# Patient Record
Sex: Male | Born: 1940 | Race: White | Hispanic: No | Marital: Married | Smoking: Former smoker
Health system: Southern US, Community
[De-identification: ages and names within clinical notes are randomized; demographics above are authoritative.]

## PROBLEM LIST (undated history)

## (undated) DIAGNOSIS — I10 Essential (primary) hypertension: Secondary | ICD-10-CM

## (undated) DIAGNOSIS — E785 Hyperlipidemia, unspecified: Secondary | ICD-10-CM

## (undated) DIAGNOSIS — K219 Gastro-esophageal reflux disease without esophagitis: Secondary | ICD-10-CM

## (undated) DIAGNOSIS — J9801 Acute bronchospasm: Secondary | ICD-10-CM

## (undated) DIAGNOSIS — I519 Heart disease, unspecified: Secondary | ICD-10-CM

## (undated) DIAGNOSIS — M5136 Other intervertebral disc degeneration, lumbar region: Secondary | ICD-10-CM

## (undated) DIAGNOSIS — M51369 Other intervertebral disc degeneration, lumbar region without mention of lumbar back pain or lower extremity pain: Secondary | ICD-10-CM

## (undated) DIAGNOSIS — N200 Calculus of kidney: Secondary | ICD-10-CM

## (undated) DIAGNOSIS — J329 Chronic sinusitis, unspecified: Secondary | ICD-10-CM

## (undated) DIAGNOSIS — N400 Enlarged prostate without lower urinary tract symptoms: Secondary | ICD-10-CM

## (undated) DIAGNOSIS — M1990 Unspecified osteoarthritis, unspecified site: Secondary | ICD-10-CM

## (undated) DIAGNOSIS — K222 Esophageal obstruction: Secondary | ICD-10-CM

## (undated) DIAGNOSIS — R0683 Snoring: Secondary | ICD-10-CM

## (undated) HISTORY — DX: Other intervertebral disc degeneration, lumbar region: M51.36

## (undated) HISTORY — DX: Acute bronchospasm: J98.01

## (undated) HISTORY — DX: Other intervertebral disc degeneration, lumbar region without mention of lumbar back pain or lower extremity pain: M51.369

## (undated) HISTORY — DX: Snoring: R06.83

## (undated) HISTORY — DX: Heart disease, unspecified: I51.9

## (undated) HISTORY — PX: CATARACT EXTRACTION: SUR2

## (undated) HISTORY — DX: Benign prostatic hyperplasia without lower urinary tract symptoms: N40.0

## (undated) HISTORY — PX: COLONOSCOPY: SHX174

## (undated) HISTORY — PX: NASAL SINUS SURGERY: SHX719

## (undated) HISTORY — DX: Chronic sinusitis, unspecified: J32.9

## (undated) HISTORY — DX: Hyperlipidemia, unspecified: E78.5

## (undated) HISTORY — DX: Essential (primary) hypertension: I10

## (undated) HISTORY — DX: Esophageal obstruction: K22.2

---

## 1999-07-21 ENCOUNTER — Ambulatory Visit (HOSPITAL_COMMUNITY): Admission: RE | Admit: 1999-07-21 | Discharge: 1999-07-21 | Payer: Self-pay | Admitting: Cardiology

## 1999-07-21 ENCOUNTER — Emergency Department (HOSPITAL_COMMUNITY): Admission: EM | Admit: 1999-07-21 | Discharge: 1999-07-21 | Payer: Self-pay | Admitting: Emergency Medicine

## 1999-07-23 ENCOUNTER — Encounter: Payer: Self-pay | Admitting: *Deleted

## 1999-07-26 ENCOUNTER — Inpatient Hospital Stay (HOSPITAL_COMMUNITY): Admission: RE | Admit: 1999-07-26 | Discharge: 1999-07-28 | Payer: Self-pay | Admitting: *Deleted

## 1999-07-26 ENCOUNTER — Encounter (INDEPENDENT_AMBULATORY_CARE_PROVIDER_SITE_OTHER): Payer: Self-pay

## 1999-10-01 ENCOUNTER — Encounter: Payer: Self-pay | Admitting: Gastroenterology

## 1999-10-01 ENCOUNTER — Encounter: Admission: RE | Admit: 1999-10-01 | Discharge: 1999-10-01 | Payer: Self-pay | Admitting: Gastroenterology

## 1999-10-14 ENCOUNTER — Encounter: Payer: Self-pay | Admitting: Gastroenterology

## 1999-10-14 ENCOUNTER — Ambulatory Visit (HOSPITAL_COMMUNITY): Admission: RE | Admit: 1999-10-14 | Discharge: 1999-10-14 | Payer: Self-pay | Admitting: Gastroenterology

## 2000-05-03 ENCOUNTER — Ambulatory Visit (HOSPITAL_COMMUNITY): Admission: RE | Admit: 2000-05-03 | Discharge: 2000-05-03 | Payer: Self-pay | Admitting: Orthopedic Surgery

## 2000-05-08 HISTORY — PX: HEEL SPUR SURGERY: SHX665

## 2000-07-08 HISTORY — PX: TRANSURETHRAL RESECTION OF PROSTATE: SHX73

## 2000-07-25 ENCOUNTER — Ambulatory Visit (HOSPITAL_BASED_OUTPATIENT_CLINIC_OR_DEPARTMENT_OTHER): Admission: RE | Admit: 2000-07-25 | Discharge: 2000-07-25 | Payer: Self-pay | Admitting: Plastic Surgery

## 2000-07-25 ENCOUNTER — Encounter (INDEPENDENT_AMBULATORY_CARE_PROVIDER_SITE_OTHER): Payer: Self-pay | Admitting: Specialist

## 2000-11-07 HISTORY — PX: ESOPHAGEAL DILATION: SHX303

## 2002-05-25 ENCOUNTER — Observation Stay (HOSPITAL_COMMUNITY): Admission: EM | Admit: 2002-05-25 | Discharge: 2002-05-26 | Payer: Self-pay | Admitting: Emergency Medicine

## 2002-05-25 ENCOUNTER — Encounter: Payer: Self-pay | Admitting: Emergency Medicine

## 2003-12-12 ENCOUNTER — Encounter: Admission: RE | Admit: 2003-12-12 | Discharge: 2003-12-12 | Payer: Self-pay | Admitting: Otolaryngology

## 2008-06-06 ENCOUNTER — Emergency Department (HOSPITAL_COMMUNITY): Admission: EM | Admit: 2008-06-06 | Discharge: 2008-06-06 | Payer: Self-pay | Admitting: Emergency Medicine

## 2008-06-13 ENCOUNTER — Ambulatory Visit (HOSPITAL_COMMUNITY): Admission: RE | Admit: 2008-06-13 | Discharge: 2008-06-13 | Payer: Self-pay | Admitting: Cardiovascular Disease

## 2008-06-23 ENCOUNTER — Inpatient Hospital Stay (HOSPITAL_BASED_OUTPATIENT_CLINIC_OR_DEPARTMENT_OTHER): Admission: RE | Admit: 2008-06-23 | Discharge: 2008-06-23 | Payer: Self-pay | Admitting: Cardiovascular Disease

## 2009-02-13 ENCOUNTER — Ambulatory Visit (HOSPITAL_COMMUNITY): Admission: RE | Admit: 2009-02-13 | Discharge: 2009-02-13 | Payer: Self-pay | Admitting: Gastroenterology

## 2010-05-19 LAB — POCT CARDIAC MARKERS: Myoglobin, poc: 87.2 ng/mL (ref 12–200)

## 2010-05-19 LAB — BASIC METABOLIC PANEL
Chloride: 106 mEq/L (ref 96–112)
Creatinine, Ser: 1.21 mg/dL (ref 0.4–1.5)
GFR calc Af Amer: >60 mL/min (ref >60)
GFR calc non Af Amer: >60 mL/min (ref >60)

## 2010-06-22 NOTE — Cardiovascular Report (Signed)
NAME:  Todd Cummings, Todd Cummings NO.:  [id number]   MEDICAL RECORD NO.:  [id number]          PATIENT TYPE:  OIB   LOCATION:  1962                         FACILITY:  [facility]   PHYSICIAN:  Vesta Mixer, M.D. DATE OF BIRTH:  1940-04-27   DATE OF PROCEDURE:  06/23/2008  DATE OF DISCHARGE:  06/23/2008                            CARDIAC CATHETERIZATION   [name] is a middle-aged gentleman with a history of hypertension,  hyperlipidemia, and esophageal strictures.  He recently had a stress  Cardiolite study, which revealed mild-to-moderate left ventricular  dysfunction.  He is referred for heart catheterization following left  ventricular systolic function.   The procedure was left heart catheterization with coronary angiography.   The right femoral artery was easily cannulated using a modified  Seldinger technique.   HEMODYNAMICS:  LV pressure is 110/12 and aortic pressure 110/56.   Angiography of the left main.  The left main is smooth and normal.   The left anterior descending artery has minor luminal irregularities in  the mid vessel.  The diagonal arteries are normal.   The left circumflex artery is very large and is dominant.  It gives off  a high obtuse marginal artery, which may actually be considered a ramus  intermediate branch.  This branch is fairly normal.  The remaining  obtuse marginal arteries are normal.  The posterolateral branches and  the posterior descending branch is normal.   The right coronary artery is small and is nondominant.  It supplies RV  marginal branches only.   Left ventriculogram was performed in a 30-RAO position.  It reveals low  normal left ventricular systolic function.  Ejection fraction is around  50%.  There is no significant mitral regurgitation.   COMPLICATIONS:  None.   CONCLUSION:  1. Minor coronary artery irregularities.  2. Low-normal left ventricular systolic function.  We will continue      with medical  therapy.      Vesta Mixer, M.D.  Electronically Signed    PJN/MEDQ  D:  06/23/2008  T:  06/23/2008  Job:  [id number]   cc:   Loraine Leriche A. [name], M.D.

## 2010-06-22 NOTE — H&P (Signed)
NAME:  BRONDON, WANN NO.:  [id number]   MEDICAL RECORD NO.:  [id number]          PATIENT TYPE:  OUT   LOCATION:  PULM                         FACILITY:  [facility]   PHYSICIAN:  Vesta Mixer, M.D. DATE OF BIRTH:  [birth year]   DATE OF ADMISSION:  06/13/2008  DATE OF DISCHARGE:  06/13/2008                              HISTORY & PHYSICAL   HISTORY:  Rett Stehlik is a middle-aged gentleman with a history of  hypertension, hyperlipidemia, and esophageal strictures.  He is admitted  now for heart catheterization after having a stress Cardiolite study  that revealed a mild to moderately depressed left ventricular systolic  function.   Damarrion is a middle-aged gentleman who we have seen intermittently over the  years.  He has had some intermittent episodes of chest pain.  We have  performed several stress Cardiolite studies.  None of these have  revealed any ischemia but one of them revealed a mildly depressed left  ventricular systolic function with an EF of 44%.   Now Jayten presents with chest pain and shortness of breath with minor  exertion.  The pain is described as a chest tightness.  These episodes  have been there for several weeks.  There are no specific exacerbating  factors.  Specifically, there is no association with eating, drinking,  change of position or deep breath.  He wonders whether there is an  exertional component.  The pain does not radiate.  It is not associated  with nausea, vomiting or diaphoresis.   He also has some dyspnea on exertion that seems to be new.  He tried  some of his wife's Symbicort and that seems to have helped a little bit.  Dr. Waynard Edwards gave him some asthmatics but that disagreed with him and made  his blood pressure go up.   He also complains of having lots of indigestion and has been belching  quite frequently.   CURRENT MEDICATIONS:  1. Nexium 40 mg twice a day.  2. Proscar 5 mg a day.  3. Lisinopril 5 mg twice a day.  4.  Aspirin 81 mg a day.  5. Multivitamin once a day.  6. Celebrex 100 mg a day.  7. Lovaza four times a day.  8. Crestor 20 mg a day.  9. Niacin 1000 mg a day.  10.Xopenex as needed.   ALLERGIES:  None.   PAST MEDICAL HISTORY:  1. Hyperlipidemia.  2. Hypertension.  3. Esophageal strictures.  4. History of mildly depressed left ventricular systolic function.   SOCIAL HISTORY:  The patient is a nonsmoker.  He drinks alcohol  occasionally.   FAMILY HISTORY:  The patient is adopted and does not know his biological  parents.   REVIEW OF SYSTEMS:  Reviewed in the HPI.  All other systems were  negative.  It should be noted that the patient used to smoke but quit  about 7 years ago.   PHYSICAL EXAMINATION:  GENERAL:  He is a middle-aged gentleman in no  acute distress.  He is alert and oriented x3 and his mood and affect  are  normal.  VITAL SIGNS:  His weight is 230.  His blood pressure is 132/90 with a  heart rate of 64.  HEENT:  2+ carotids, he has no bruits, no JVD, no thyromegaly.  His  sclerae are nonicteric.  His mucous membranes are moist.  NECK:  Supple.  LUNGS:  Clear.  BACK:  Nontender.  HEART:  Regular rate S1-S2.  His PMI is nondisplaced.  ABDOMEN:  Good bowel sounds and is nontender.  There is no guarding or  rebound.  There is no hepatosplenomegaly.  EXTREMITIES:  He has no  clubbing, cyanosis or edema.  His pulses are 1 to 2+.  There is no rash  and there is no edema.  His pulses are intact.  His gait is normal.  NEURO:  Cranial nerves II-XII are intact and his motor and sensory  function intact.   Mr. Life presents with a mild to moderate left ventricular dysfunction.  The ejection fraction by Cardiolite scanning was 44%.  I do not have an  explanation of this.  Given these abnormalities and his symptoms, I  would like to proceed with a cardiac catheterization.  He has numerous  risk factors for coronary artery disease.  We have discussed the risks,  benefits  and options of heart catheterization.  He understands and  agrees to proceed.      Vesta Mixer, M.D.  Electronically Signed     PJN/MEDQ  D:  06/18/2008  T:  06/19/2008  Job:  [id number]   cc:   Loraine Leriche A. [name], M.D.

## 2010-06-25 NOTE — Op Note (Signed)
[place]. [facility]  Patient:    Todd Cummings, Todd Cummings                           MRN: [id number] Proc. Date: 05/03/00 Attending:  Georges Lynch. Darrelyn Hillock, M.D.                           Operative Report  PREOPERATIVE DIAGNOSIS: 1. Heel spur and plantar fasciitis, right foot. 2. Heel spur and plantar fasciitis, left foot.  POSTOPERATIVE DIAGNOSIS: 1. Heel spur and plantar fasciitis, right foot. 2. Heel spur and plantar fasciitis, left foot.  OPERATION PERFORMED: 1. Open excision of a heel spur of the left os calcis. 2. Plantar fasciotomy left os calcis. 3. Injection of his opposite right heel with 1 cc 0.5% Marcaine without    epinephrine and 1 cc of Depo-Medrol.  SURGEON:  Georges Lynch. Darrelyn Hillock, M.D.  ASSISTANT:  Nurse.  ANESTHESIA:  General.  DESCRIPTION OF PROCEDURE:  Under general anesthesia, routine orthopedic prepping and draping of the left foot was carried out.  The leg was exsanguinated with an Esmarch and tourniquet was elevated at 300 mmHg.  At this time a small incision was made over the medial aspect of the left foot. Bleeders were identified and cauterized.  I then went down and identified the plantar fascia and the heel spur of the os calcis.  I carefully detached the plantar fascia from the os calcis and removed a section of it.  Great care was taken not to injury the underlying neurovascular structures.  At this time I then utilized the osteotome to remove the heel spur from his os calcis.  I thoroughly irrigated out the wound and closed the wound in layers in usual fashion.  Next, a sterile prep was made over the right heel region.  I then injected 1 cc of 0.5% Marcaine without epinephrine and 1 cc of Depo-Medrol. The patient left the operating room in satisfactory condition and had 1 gm of IV Ancef preop. DD:  05/03/00 TD:  05/03/00 Job: [id number]

## 2010-06-25 NOTE — H&P (Signed)
NAME:  Todd Cummings, Todd Cummings                            ACCOUNT NO.:  [id number]   MEDICAL RECORD NO.:  [id number]                   PATIENT TYPE:  EMS   LOCATION:  ED                                   FACILITY:  [facility]   PHYSICIAN:  Gaspar Garbe, M.D.            DATE OF BIRTH:  07-11-1940   DATE OF ADMISSION:  05/25/2002  DATE OF DISCHARGE:                                HISTORY & PHYSICAL   CHIEF COMPLAINT:  Chest pain on exertion.   HISTORY OF PRESENT ILLNESS:  The patient is a 70 year old white male with a  history of hyperlipidemia, hypertension not yet treated to goal, esophageal  stricture and BPH status post surgery who comes to the hospital emergency  room after having an episode of chest pain.  He indicated that he was doing  some work on some fountains in his yard after being at [employer] and that he  began having chest pain which felt like a tightness in the right side of his  chest.  It extended to the left side of his chest and then went up the left  side to his jaw.  His wife gave him an aspirin and he was taken to [facility] Emergency Room where he was evaluated.  The patient indicated  that his pain lasted a total of 30 minutes and after about 20 minutes  started to ease off a little bit.  He was given a GI cocktail in the  emergency room but was already pain free at that time.  He indicates that he  has had pains like this before but never lasting this long.  He is not  certain whether this is typical for his reflux or esophageal pain.  He  indicates that it does not hurt when he currently moves his arms or takes a  deep breath.   PAST MEDICAL HISTORY:  1. Kidney stones.  2. BPH status post TURP.  3. Esophageal stricture.  4. Hyperlipidemia on Zocor.  5. Hypertension, not currently on medication but above normal  guidelines at     this point.   PAST SURGICAL HISTORY:  1. Patient has had heel spur and fascial repairs in March 2002.  2. EGD with  dilatation on 10/14/99.  3. TURP secondary to BPH in 2001.  4. He has had hernia repair in 1991.   ALLERGIES:  No known drug allergies.   MEDICATIONS:  1. Zocor 40 mg p.o. daily.  2. Vioxx 25 mg p.o. daily.  3. Nexium 40 mg p.o. daily.  4. Proscar 5 mg daily presumably patient did not have his medications with     him.   SOCIAL HISTORY:  Patient is married with three children.  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  He has a history of smoking and he quit in  December of 2003 after  approximately 20 pack years.  He is a social drinker.  He does not have a  regular exercise regimen and only takes a multivitamin but no other herbal  supplements.   FAMILY HISTORY:  The patient was adopted.   REVIEW OF SYSTEMS:  Patient is chest pain free currently.  He has no  shortness of breath or current other complaints.  There are no pains in his  neck.  No difficulty with moving his arms or legs and is globally intact.  As such this patient is a full code.   PHYSICAL EXAMINATION:  VITAL SIGNS:  Temperature 97.8, pulse 74, blood  pressure 146/74.  He has had blood pressures on the monitor ranging from the  140s-150s systolic and upper 70s to mid 16X diastolic.  He is sating 96% on  room air.  GENERAL:  No acute distress.  Very pleasant.  Accompanied by wife.  HEENT:  PERRLA.  EOMI.  ENT is within normal limits.  NECK:  Supple.  No lymphadenopathy, JVD or bruits.  HEART:  Regular rate and rhythm.  No murmur, rub or gallop.  LUNGS:  Clear to auscultation bilaterally.  ABDOMEN:  Soft, nontender.  Normoactive bowel sounds.  EXTREMITIES:  No clubbing, cyanosis or edema noted.  NEUROLOGIC:  Globally intact.   ASSESSMENT/PLAN:  1. Chest pain.  Patient will be ruled out for myocardial infarction with     serial enzymes.  His first set of enzymes at this point was negative with     a CK of 74, troponin of 0.01 and an MB fraction of 1.2.  He is  having     other labs drawn in the emergency room.  We will draw a CBC and a B-MET     on him as well as lipid testing.  If he in fact rules out for MI then     other possible causes of his pain are musculoskeletal or related to     esophageal spasm, stricture or reflux as he has a history of this.  2. Gastroesophageal reflux disease.  Continue on Nexium or equivalent in the     hospital.  3. Hypertension.  Start the patient on 5 mg of lisinopril so as not to use     beta-blockade due to his low heart rate which has ranged from the low 60     to low 70s.  4. Hyperlipidemia.  We will continue Zocor.  5. Osteoarthritis.  We will continue Vioxx.  He takes it properly and     separates it from his aspirin.  6. If patient rules out for MI we will send him home with nitroglycerin and     look at doing a follow up stress test as an outpatient.  If he rules in     he will be seen by cardiologist and managed most likely with     catheterization.  7. Patient was agreeable with this as was his wife.                                               Gaspar Garbe, M.D.    RWT/MEDQ  D:  05/25/2002  T:  05/25/2002  Job:  [id number]   cc:   Loraine Leriche A. Waynard Edwards, M.D.  [address]  [place]  [place] [address]  Fax: [phone]

## 2010-06-25 NOTE — Op Note (Signed)
[facility]  Patient:    Todd Cummings, Todd Cummings                         MRN: [id number] Proc. Date: 07/26/99 Adm. Date:  [id number] Attending:  Dalbert Mayotte CC:         Rana Snare Judie Grieve, M.D.             Radene Knee., M.D.                           Operative Report  PREOPERATIVE DIAGNOSIS: 1. Urinary retention with benign prostatic hyperplasia. 2. Prostatitis. 3. History of passing a ureteral calculus, 1981. 4. Right renal cyst 19 mm by ultrasound. 5. Recent negative stress test by Dr. Judie Grieve.  POSTOPERATIVE DIAGNOSIS: 1. Urinary retention with benign prostatic hyperplasia. 2. Prostatitis. 3. History of passing a ureteral calculus, 1981. 4. Right renal cyst 19 mm by ultrasound. 5. Recent negative stress test by Dr. Judie Grieve.  OPERATION PERFORMED:  Transurethral resection of the prostate.  DESCRIPTION OF PROCEDURE:  This 70 year old male with PSA 3.46 and urinary retention onset Jul 08, 1999 comes in for transurethral resection of the prostate. He his prepared with IV gentamycin 80 mg and underwent successful induction of general anesthesia. He had been given p.o. Levaquin preoperatively as well.  The patient was prepped and draped in the lithotomy position and the urethra was dilated 26-30 french. A #28 Olympus continuous flow resectoscope was introduced and the bladder inspected with the 70 and 12 degree lenses. The patient had considerable hyperemia and erythema of the bladder from his Foley catheter which had been indwelling. He did have a small cellule adjacent to the right ureteral orifice, the left ureteral orifice was normal. He had trabeculation of the bladder but no stone or tumor was noted in the bladder. There was no middle lobe hypertrophy but he had kissing lateral lobes for a distance of 3-4 cm which were obstructing. There was no urethral stricture. The resection was begun with the right lateral lobe and it was resected from 12  to 6 oclock in a counter clockwise fashion. The left lobe was then resected in a clockwise fashion from 12 to 6 oclock. At the conclusion of the resection, all bleeders were carefully cauterized, the tissue was removed with the [facility] evacuator and the ureteral orifices were noted to be undamaged. There was a little additional tissue removed at the apex which was noted to be obstructive and the bladder was then drained with a #22 30 cc Foley catheter. Irrigation cleared promptly and the patient returned to the recovery area in a stable condition.  PLAN:  Treat the patient gentamycin and Levaquin and observe him for 48 hours as an inpatient. DD:  07/26/99 TD:  07/28/99 Job: [id number]

## 2010-06-25 NOTE — Discharge Summary (Signed)
[facility]  Patient:    Todd Cummings, Todd Cummings                         MRN: [id number] Adm. Date:  [id number] Disc. Date: [id number] Attending:  Dalbert Mayotte CC:         Rana Snare Judie Grieve, M.D.                           Discharge Summary  HISTORY OF PRESENT ILLNESS:  This 70 year-old male with urinary retention, onset Jul 08, 1999, was treated initially with pelvic catheter and antibiotics.  Failed a voiding trial in our office.  He was seen by Dr. Judie Grieve because of chest pain and had a negative stress test.  He was advised of the advantages, disadvantages of TURP and other treatment modalities, chose TRP and the prostate was resected on July 26, 1999 under general anesthesia.  The patient had had a preoperative PSA of 3.46.  His path report was benign. Postoperatively the patient remained afebrile on Levaquin and gentamicin.  His Foley catheter was removed early on July 28, 1999.  The patient voided satisfactorily and was discharged home without Foley catheter.  PAST MEDICAL HISTORY  The past history, range of systems and family history reveals that the patient has been on no regular medication other than Vioxx. He was given some Levaquin preoperatively and he had been using some Coricidin D in the past.  He had been given Septra DS when they inserted his Foley catheter in [facility] Emergency Room.  The patient did give a history of a hernia repair in 1999.  The patient also passed a ureteral stone in 1981. The remainder of his review of systems and history is really quite unremarkable.  FAMILY HISTORY:  Unknown since he is adopted.  SOCIAL HISTORY:  Reveals that he is married.  He works in Airline pilot.  Commutes weekly to [employer].  He smokes one and a half packs cigarettes per week. Uses alcohol four to five drinks per week.  PHYSICAL EXAMINATION:  GENERAL:  A 70 year old male with Foley catheter in place.  Well-developed, well nourished.  VITAL  SIGNS:  Blood pressure was 128/75, his pulse was slow at 57, temperature 98.  HEENT:  The examination was unremarkable with normal eyes, ears, nose, throat and teeth.  CHEST:  Clear to percussion and auscultation.  No murmur detected.  ABDOMEN:  Mildly obese.  No masses or tenderness.  GENITOURINARY:  The penis was circumcised.  Foley catheter in place.  Testes good size and symmetrical.  Rectal tone good.  Prostate estimated 20 to 25 grams, symmetrical, smooth, firm, round, slightly tender.  EXTREMITIES:  With no edema. His reflexes and sensation were grossly normal.  LABORATORY DATA:  The patients laboratory investigation revealed a normal CBC with postoperative hematocrit 41, white count 6100.  The creatinine was 1.3. The chemistries were within normal limits with the ALT at 42, slightly elevated, upper limits of normal 40.  PSA was 3.46.  Other chemistries were normal.  Chest x-ray showed no active disease.  KUB showed no evidence of stone. Ultrasound in the office had revealed no hydronephrosis.  The EKG did show a sinus bradycardia with rate of 57, otherwise normal EKG.  DIAGNOSES: 1. Benign prostatic hyperplasia with urinary retention. 2. Prostatitis and urinary tract infection secondary to Foley catheter. 3. Right renal cyst by ultrasound, 19 mm. 4. Negative stress test  by Dr. Judie Grieve with bradycardia. 5. Ureteral calculus, passed 1981.  PROCEDURE:  Transurethral prostatectomy.  PLAN:  Regular diet.  Force fluids.  Light activity.  Return to office next week.  DISCHARGE MEDICATIONS: 1. Levaquin 500 mg every 24 hours. 2. Vicodin 1 q.4h. p.r.n. pain. 3. He may resume his Vioxx.  CONDITION ON DISCHARGE:  Improved. DD:  07/28/99 TD:  07/29/99 Job: [id number]

## 2010-06-25 NOTE — Procedures (Signed)
[name]  Patient:    Todd Cummings, Todd Cummings                         MRN: [id number] Proc. Date: 10/14/99 Adm. Date:  [id number] Attending:  Orland Mustard CC:         Radene Knee., M.D.             Edwin L. Judie Grieve, M.D.                           Procedure Report  PROCEDURE:  Esophagogastroduodenoscopy with esophageal dilatation.  MEDICATIONS:  Hurricaine spray, fentanyl 100 mcg, Versed 8 mg IV.  INDICATIONS:  Dysphagia with barium swallow showing a narrowing and hang-up at the GE junction, 13 mm tablet would not pass.  Patient has not had any symptoms of esophageal reflux.  DESCRIPTION OF PROCEDURE:  The procedure had been explained to the patient, including potential risks and benefits and consent obtained.  The patient in the left lateral decubitus position.  The Olympus video endoscope inserted blindly into the esophagus and advanced under visualization.  The distal esophagus was reached.  There was in fact a stricture with some slight ulceration above it.  The scope passed with minimal pressure.  Complete endoscopy was performed.  The duodenum, including the bulb and second portion was unremarkable, as was the antrum and body of the stomach.  Using fluoroscopic guidance, a Savary guidewire was placed through the scope and the scope withdrawn over the guidewire.  Then, with the patients neck extended, we passed in 36, 39, 42 and 45-French Savary dilators.  There was no heme, minimal resistance.  After passage of the 45-French dilator, the dilator and guidewire were withdrawn as a unit.  The patient tolerated the procedure well and was maintained on low-flow oxygen and pulse oximetry throughout the procedure.  There were no immediate complications.  ASSESSMENT: 1. Esophageal stricture, dilated to 45-French. 2. Esophageal ulceration probably due to esophageal reflux disease.  PLAN:  Will start the patient on Nexium and give him a reflux  sheet and see him back in the office in 2-3 months. DD:  10/14/99 TD:  10/14/99 Job: [id number]

## 2010-08-23 ENCOUNTER — Telehealth: Payer: Self-pay | Admitting: Cardiovascular Disease

## 2010-08-23 NOTE — Telephone Encounter (Signed)
FAX RECS TODAY TO [facility] AT [phone]. PT SURGERY IS TOMORROW MORNING.

## 2011-04-07 DIAGNOSIS — E119 Type 2 diabetes mellitus without complications: Secondary | ICD-10-CM | POA: Diagnosis not present

## 2011-04-18 DIAGNOSIS — N400 Enlarged prostate without lower urinary tract symptoms: Secondary | ICD-10-CM | POA: Diagnosis not present

## 2011-04-18 DIAGNOSIS — R310 Gross hematuria: Secondary | ICD-10-CM | POA: Diagnosis not present

## 2011-04-18 DIAGNOSIS — K4090 Unilateral inguinal hernia, without obstruction or gangrene, not specified as recurrent: Secondary | ICD-10-CM | POA: Diagnosis not present

## 2011-04-27 ENCOUNTER — Encounter: Payer: Self-pay | Admitting: *Deleted

## 2011-05-18 DIAGNOSIS — J331 Polypoid sinus degeneration: Secondary | ICD-10-CM | POA: Diagnosis not present

## 2011-05-18 DIAGNOSIS — R51 Headache: Secondary | ICD-10-CM | POA: Diagnosis not present

## 2011-05-18 DIAGNOSIS — J310 Chronic rhinitis: Secondary | ICD-10-CM | POA: Diagnosis not present

## 2011-05-25 DIAGNOSIS — E119 Type 2 diabetes mellitus without complications: Secondary | ICD-10-CM | POA: Diagnosis not present

## 2011-05-25 DIAGNOSIS — E785 Hyperlipidemia, unspecified: Secondary | ICD-10-CM | POA: Diagnosis not present

## 2011-05-25 DIAGNOSIS — Z125 Encounter for screening for malignant neoplasm of prostate: Secondary | ICD-10-CM | POA: Diagnosis not present

## 2011-05-25 DIAGNOSIS — I10 Essential (primary) hypertension: Secondary | ICD-10-CM | POA: Diagnosis not present

## 2011-05-25 DIAGNOSIS — M109 Gout, unspecified: Secondary | ICD-10-CM | POA: Diagnosis not present

## 2011-05-31 DIAGNOSIS — Z1212 Encounter for screening for malignant neoplasm of rectum: Secondary | ICD-10-CM | POA: Diagnosis not present

## 2011-06-01 DIAGNOSIS — I10 Essential (primary) hypertension: Secondary | ICD-10-CM | POA: Diagnosis not present

## 2011-06-01 DIAGNOSIS — E119 Type 2 diabetes mellitus without complications: Secondary | ICD-10-CM | POA: Diagnosis not present

## 2011-06-01 DIAGNOSIS — Z0000 Encounter for general adult medical examination without abnormal findings: Secondary | ICD-10-CM | POA: Diagnosis not present

## 2011-06-01 DIAGNOSIS — E785 Hyperlipidemia, unspecified: Secondary | ICD-10-CM | POA: Diagnosis not present

## 2011-06-01 DIAGNOSIS — K219 Gastro-esophageal reflux disease without esophagitis: Secondary | ICD-10-CM | POA: Diagnosis not present

## 2011-06-01 DIAGNOSIS — Z79899 Other long term (current) drug therapy: Secondary | ICD-10-CM | POA: Diagnosis not present

## 2011-06-01 DIAGNOSIS — J438 Other emphysema: Secondary | ICD-10-CM | POA: Diagnosis not present

## 2011-07-12 ENCOUNTER — Encounter: Payer: Self-pay | Admitting: Cardiovascular Disease

## 2011-08-24 DIAGNOSIS — H52229 Regular astigmatism, unspecified eye: Secondary | ICD-10-CM | POA: Diagnosis not present

## 2011-08-24 DIAGNOSIS — H5200 Hypermetropia, unspecified eye: Secondary | ICD-10-CM | POA: Diagnosis not present

## 2011-08-24 DIAGNOSIS — H524 Presbyopia: Secondary | ICD-10-CM | POA: Diagnosis not present

## 2011-08-24 DIAGNOSIS — H2510 Age-related nuclear cataract, unspecified eye: Secondary | ICD-10-CM | POA: Diagnosis not present

## 2011-10-25 DIAGNOSIS — Z23 Encounter for immunization: Secondary | ICD-10-CM | POA: Diagnosis not present

## 2011-11-22 DIAGNOSIS — R7301 Impaired fasting glucose: Secondary | ICD-10-CM | POA: Diagnosis not present

## 2011-11-22 DIAGNOSIS — Z23 Encounter for immunization: Secondary | ICD-10-CM | POA: Diagnosis not present

## 2011-11-22 DIAGNOSIS — E785 Hyperlipidemia, unspecified: Secondary | ICD-10-CM | POA: Diagnosis not present

## 2011-11-22 DIAGNOSIS — I10 Essential (primary) hypertension: Secondary | ICD-10-CM | POA: Diagnosis not present

## 2011-12-12 DIAGNOSIS — B079 Viral wart, unspecified: Secondary | ICD-10-CM | POA: Diagnosis not present

## 2011-12-12 DIAGNOSIS — D2370 Other benign neoplasm of skin of unspecified lower limb, including hip: Secondary | ICD-10-CM | POA: Diagnosis not present

## 2011-12-12 DIAGNOSIS — L909 Atrophic disorder of skin, unspecified: Secondary | ICD-10-CM | POA: Diagnosis not present

## 2011-12-12 DIAGNOSIS — D239 Other benign neoplasm of skin, unspecified: Secondary | ICD-10-CM | POA: Diagnosis not present

## 2011-12-12 DIAGNOSIS — Z85828 Personal history of other malignant neoplasm of skin: Secondary | ICD-10-CM | POA: Diagnosis not present

## 2011-12-12 DIAGNOSIS — L819 Disorder of pigmentation, unspecified: Secondary | ICD-10-CM | POA: Diagnosis not present

## 2011-12-12 DIAGNOSIS — L821 Other seborrheic keratosis: Secondary | ICD-10-CM | POA: Diagnosis not present

## 2011-12-21 DIAGNOSIS — Z85828 Personal history of other malignant neoplasm of skin: Secondary | ICD-10-CM | POA: Diagnosis not present

## 2011-12-21 DIAGNOSIS — D485 Neoplasm of uncertain behavior of skin: Secondary | ICD-10-CM | POA: Diagnosis not present

## 2011-12-21 DIAGNOSIS — L820 Inflamed seborrheic keratosis: Secondary | ICD-10-CM | POA: Diagnosis not present

## 2012-05-08 DIAGNOSIS — R310 Gross hematuria: Secondary | ICD-10-CM | POA: Diagnosis not present

## 2012-05-08 DIAGNOSIS — N529 Male erectile dysfunction, unspecified: Secondary | ICD-10-CM | POA: Diagnosis not present

## 2012-05-08 DIAGNOSIS — N400 Enlarged prostate without lower urinary tract symptoms: Secondary | ICD-10-CM | POA: Diagnosis not present

## 2012-06-14 ENCOUNTER — Encounter: Payer: Self-pay | Admitting: Cardiovascular Disease

## 2012-07-23 DIAGNOSIS — E1169 Type 2 diabetes mellitus with other specified complication: Secondary | ICD-10-CM | POA: Diagnosis not present

## 2012-07-23 DIAGNOSIS — M109 Gout, unspecified: Secondary | ICD-10-CM | POA: Diagnosis not present

## 2012-07-23 DIAGNOSIS — E785 Hyperlipidemia, unspecified: Secondary | ICD-10-CM | POA: Diagnosis not present

## 2012-07-23 DIAGNOSIS — Z125 Encounter for screening for malignant neoplasm of prostate: Secondary | ICD-10-CM | POA: Diagnosis not present

## 2012-07-23 DIAGNOSIS — I10 Essential (primary) hypertension: Secondary | ICD-10-CM | POA: Diagnosis not present

## 2012-07-30 DIAGNOSIS — K219 Gastro-esophageal reflux disease without esophagitis: Secondary | ICD-10-CM | POA: Diagnosis not present

## 2012-07-30 DIAGNOSIS — Z0000 Encounter for general adult medical examination without abnormal findings: Secondary | ICD-10-CM | POA: Diagnosis not present

## 2012-07-30 DIAGNOSIS — N529 Male erectile dysfunction, unspecified: Secondary | ICD-10-CM | POA: Diagnosis not present

## 2012-07-30 DIAGNOSIS — M1990 Unspecified osteoarthritis, unspecified site: Secondary | ICD-10-CM | POA: Diagnosis not present

## 2012-07-30 DIAGNOSIS — Z79899 Other long term (current) drug therapy: Secondary | ICD-10-CM | POA: Diagnosis not present

## 2012-07-30 DIAGNOSIS — E785 Hyperlipidemia, unspecified: Secondary | ICD-10-CM | POA: Diagnosis not present

## 2012-07-30 DIAGNOSIS — Z1331 Encounter for screening for depression: Secondary | ICD-10-CM | POA: Diagnosis not present

## 2012-07-30 DIAGNOSIS — E119 Type 2 diabetes mellitus without complications: Secondary | ICD-10-CM | POA: Diagnosis not present

## 2012-07-30 DIAGNOSIS — I10 Essential (primary) hypertension: Secondary | ICD-10-CM | POA: Diagnosis not present

## 2012-07-30 DIAGNOSIS — Z125 Encounter for screening for malignant neoplasm of prostate: Secondary | ICD-10-CM | POA: Diagnosis not present

## 2012-08-01 DIAGNOSIS — Z1212 Encounter for screening for malignant neoplasm of rectum: Secondary | ICD-10-CM | POA: Diagnosis not present

## 2012-09-25 DIAGNOSIS — H524 Presbyopia: Secondary | ICD-10-CM | POA: Diagnosis not present

## 2012-09-25 DIAGNOSIS — H5231 Anisometropia: Secondary | ICD-10-CM | POA: Diagnosis not present

## 2012-09-25 DIAGNOSIS — H269 Unspecified cataract: Secondary | ICD-10-CM | POA: Diagnosis not present

## 2012-09-25 DIAGNOSIS — H52229 Regular astigmatism, unspecified eye: Secondary | ICD-10-CM | POA: Diagnosis not present

## 2012-11-01 DIAGNOSIS — Z23 Encounter for immunization: Secondary | ICD-10-CM | POA: Diagnosis not present

## 2013-01-15 DIAGNOSIS — E119 Type 2 diabetes mellitus without complications: Secondary | ICD-10-CM | POA: Diagnosis not present

## 2013-01-15 DIAGNOSIS — Z23 Encounter for immunization: Secondary | ICD-10-CM | POA: Diagnosis not present

## 2013-06-12 ENCOUNTER — Observation Stay (HOSPITAL_COMMUNITY)
Admission: EM | Admit: 2013-06-12 | Discharge: 2013-06-13 | Disposition: A | Discharge status: Home / Self Care | Payer: Medicare Other | Attending: Internal Medicine | Admitting: Internal Medicine

## 2013-06-12 ENCOUNTER — Observation Stay (HOSPITAL_COMMUNITY): Payer: Medicare Other

## 2013-06-12 ENCOUNTER — Emergency Department (HOSPITAL_COMMUNITY): Payer: Medicare Other

## 2013-06-12 ENCOUNTER — Encounter (HOSPITAL_COMMUNITY): Payer: Self-pay | Admitting: Emergency Medicine

## 2013-06-12 DIAGNOSIS — E785 Hyperlipidemia, unspecified: Secondary | ICD-10-CM | POA: Insufficient documentation

## 2013-06-12 DIAGNOSIS — Z87891 Personal history of nicotine dependence: Secondary | ICD-10-CM | POA: Diagnosis not present

## 2013-06-12 DIAGNOSIS — R911 Solitary pulmonary nodule: Secondary | ICD-10-CM

## 2013-06-12 DIAGNOSIS — G459 Transient cerebral ischemic attack, unspecified: Principal | ICD-10-CM

## 2013-06-12 DIAGNOSIS — I498 Other specified cardiac arrhythmias: Secondary | ICD-10-CM | POA: Insufficient documentation

## 2013-06-12 DIAGNOSIS — Z7982 Long term (current) use of aspirin: Secondary | ICD-10-CM | POA: Insufficient documentation

## 2013-06-12 DIAGNOSIS — J438 Other emphysema: Secondary | ICD-10-CM | POA: Diagnosis not present

## 2013-06-12 DIAGNOSIS — I10 Essential (primary) hypertension: Secondary | ICD-10-CM | POA: Diagnosis not present

## 2013-06-12 DIAGNOSIS — R413 Other amnesia: Secondary | ICD-10-CM | POA: Diagnosis not present

## 2013-06-12 DIAGNOSIS — K222 Esophageal obstruction: Secondary | ICD-10-CM | POA: Diagnosis not present

## 2013-06-12 DIAGNOSIS — J984 Other disorders of lung: Secondary | ICD-10-CM | POA: Diagnosis not present

## 2013-06-12 LAB — COMPREHENSIVE METABOLIC PANEL
ALBUMIN: 4 g/dL (ref 3.5–5.2)
ALK PHOS: 98 U/L (ref 39–117)
ALT: 35 U/L (ref 0–53)
AST: 36 U/L (ref 0–37)
BILIRUBIN TOTAL: 0.4 mg/dL (ref 0.3–1.2)
BUN: 17 mg/dL (ref 6–23)
CHLORIDE: 107 meq/L (ref 96–112)
CO2: 22 mEq/L (ref 19–32)
Calcium: 8.9 mg/dL (ref 8.4–10.5)
Creatinine, Ser: 0.96 mg/dL (ref 0.50–1.35)
GFR calc Af Amer: >90 mL/min (ref >90)
GFR calc non Af Amer: 82 mL/min — ABNORMAL LOW (ref >90)
Glucose, Bld: 111 mg/dL — ABNORMAL HIGH (ref 70–99)
POTASSIUM: 4.4 meq/L (ref 3.7–5.3)
SODIUM: 141 meq/L (ref 137–147)
Total Protein: 7.4 g/dL (ref 6.0–8.3)

## 2013-06-12 LAB — GLUCOSE, CAPILLARY
GLUCOSE-CAPILLARY: 91 mg/dL (ref 70–99)
GLUCOSE-CAPILLARY: 153 mg/dL — AB (ref 70–99)

## 2013-06-12 LAB — DIFFERENTIAL
BASOS ABS: 0 10*3/uL (ref 0.0–0.1)
BASOS PCT: 1 % (ref 0–1)
Eosinophils Absolute: 0.4 10*3/uL (ref 0.0–0.7)
Eosinophils Relative: 6 % — ABNORMAL HIGH (ref 0–5)
Lymphocytes Relative: 26 % (ref 12–46)
Lymphs Abs: 1.6 10*3/uL (ref 0.7–4.0)
Monocytes Absolute: 0.3 10*3/uL (ref 0.1–1.0)
Monocytes Relative: 5 % (ref 3–12)
NEUTROS ABS: 3.9 10*3/uL (ref 1.7–7.7)
NEUTROS PCT: 62 % (ref 43–77)

## 2013-06-12 LAB — RAPID URINE DRUG SCREEN, HOSP PERFORMED
AMPHETAMINES: NOT DETECTED
BARBITURATES: NOT DETECTED
Benzodiazepines: NOT DETECTED
COCAINE: NOT DETECTED
Opiates: NOT DETECTED
Tetrahydrocannabinol: NOT DETECTED

## 2013-06-12 LAB — I-STAT TROPONIN, ED: Troponin i, poc: 0 ng/mL (ref 0.00–0.08)

## 2013-06-12 LAB — CBC
HCT: 44.9 % (ref 39.0–52.0)
Hemoglobin: 15.4 g/dL (ref 13.0–17.0)
MCH: 31.7 pg (ref 26.0–34.0)
MCHC: 34.3 g/dL (ref 30.0–36.0)
MCV: 92.4 fL (ref 78.0–100.0)
Platelets: 185 10*3/uL (ref 150–400)
RBC: 4.86 MIL/uL (ref 4.22–5.81)
RDW: 13.8 % (ref 11.5–15.5)
WBC: 6.2 10*3/uL (ref 4.0–10.5)

## 2013-06-12 LAB — CBG MONITORING, ED: Glucose-Capillary: 104 mg/dL — ABNORMAL HIGH (ref 70–99)

## 2013-06-12 MED ORDER — LORAZEPAM 2 MG/ML IJ SOLN
0.5000 mg | Freq: Once | INTRAMUSCULAR | Status: AC
  Administered 2013-06-12: 0.5 mg via INTRAVENOUS
  Filled 2013-06-12: qty 1

## 2013-06-12 MED ORDER — PANTOPRAZOLE SODIUM 40 MG PO TBEC
80.0000 mg | DELAYED_RELEASE_TABLET | Freq: Every day | ORAL | Status: DC
  Administered 2013-06-13: 80 mg via ORAL

## 2013-06-12 MED ORDER — FINASTERIDE 5 MG PO TABS
5.0000 mg | ORAL_TABLET | Freq: Every day | ORAL | Status: DC
  Administered 2013-06-13: 5 mg via ORAL
  Filled 2013-06-12: qty 1

## 2013-06-12 MED ORDER — FLUTICASONE PROPIONATE 50 MCG/ACT NA SUSP
2.0000 | Freq: Every day | NASAL | Status: DC | PRN
  Filled 2013-06-12: qty 16

## 2013-06-12 MED ORDER — PANTOPRAZOLE SODIUM 40 MG PO TBEC: 40.0000 mg | DELAYED_RELEASE_TABLET | Freq: Every day | ORAL | Status: DC

## 2013-06-12 MED ORDER — ALBUTEROL SULFATE HFA 108 (90 BASE) MCG/ACT IN AERS: 2.0000 | INHALATION_SPRAY | Freq: Four times a day (QID) | RESPIRATORY_TRACT | Status: DC | PRN

## 2013-06-12 MED ORDER — ATORVASTATIN CALCIUM 80 MG PO TABS
80.0000 mg | ORAL_TABLET | Freq: Every evening | ORAL | Status: DC
  Filled 2013-06-12 (×2): qty 1

## 2013-06-12 MED ORDER — ALBUTEROL SULFATE (2.5 MG/3ML) 0.083% IN NEBU: 2.5000 mg | INHALATION_SOLUTION | Freq: Four times a day (QID) | RESPIRATORY_TRACT | Status: DC | PRN

## 2013-06-12 MED ORDER — ASPIRIN EC 81 MG PO TBEC
81.0000 mg | DELAYED_RELEASE_TABLET | Freq: Every day | ORAL | Status: DC
  Administered 2013-06-13: 81 mg via ORAL
  Filled 2013-06-12 (×2): qty 1

## 2013-06-12 MED ORDER — NIACIN ER (ANTIHYPERLIPIDEMIC) 1000 MG PO TBCR: 1000.0000 mg | EXTENDED_RELEASE_TABLET | Freq: Every day | ORAL | Status: DC

## 2013-06-12 MED ORDER — SODIUM CHLORIDE 0.9 % IV SOLN: INTRAVENOUS | Status: AC

## 2013-06-12 MED ORDER — OMEPRAZOLE MAGNESIUM 20 MG PO TBEC: 20.0000 mg | DELAYED_RELEASE_TABLET | Freq: Every day | ORAL | Status: DC

## 2013-06-12 MED ORDER — ENOXAPARIN SODIUM 40 MG/0.4ML ~~LOC~~ SOLN
40.0000 mg | SUBCUTANEOUS | Status: DC
  Administered 2013-06-12: 40 mg via SUBCUTANEOUS
  Filled 2013-06-12 (×2): qty 0.4

## 2013-06-12 MED ORDER — SODIUM CHLORIDE 0.9 % IV SOLN: INTRAVENOUS | Status: DC

## 2013-06-12 MED ORDER — IOHEXOL 300 MG/ML  SOLN
100.0000 mL | Freq: Once | INTRAMUSCULAR | Status: AC | PRN
  Administered 2013-06-12: 100 mL via INTRAVENOUS

## 2013-06-12 MED ORDER — LISINOPRIL 10 MG PO TABS
10.0000 mg | ORAL_TABLET | Freq: Every day | ORAL | Status: DC
  Administered 2013-06-13: 10 mg via ORAL
  Filled 2013-06-12: qty 1

## 2013-06-12 MED ORDER — NIACIN ER 500 MG PO CPCR
1000.0000 mg | ORAL_CAPSULE | Freq: Every day | ORAL | Status: DC
  Administered 2013-06-12: 1000 mg via ORAL
  Filled 2013-06-12 (×2): qty 2

## 2013-06-12 NOTE — H&P (Signed)
[facility] History and Physical  GEMINI BEAUMIER ELF:[id number] DOB: 28-Jun-1940 DOA: 06/12/2013  Referring physician: er PCP: [name], MD   Chief Complaint: AMS  HPI: Todd Cummings is a 73 y.o. male  Who is in good health.  Last night he ate 2 hamburgers, 2 beers and multiple jelly beans/reeces cup.  This AM he woke up and went to work.  He was confused when he arrived.  He could not remember how to do his job.  He called his wife and went home.  Hs wife is a PT and did orthostatics which were normal.  His BP at work was elevated but when the wife arrived, she re took it and it was 585 systolically.  Patient does not check blood sugar.   No fever, no chills, no CP, no SOB  In the ER, MRI was done that was negative for CVA.  Neuro exam was unremarkable.  Chest x ray showed a pulm nodule- 18 mm in size.  hospitalist was called for observation admission   Review of Systems:  All systems reviewed, negative unless stated above    Past Medical History  Diagnosis Date  . HTN (hypertension)   . HLD (hyperlipidemia)   . Esophageal stricture   . BPH (benign prostatic hypertrophy)   . Bronchospasm    Past Surgical History  Procedure Laterality Date  . Heel spur surgery  4/02  . Esophageal dilation  10/02  . Transurethral resection of prostate  6/02   Social History:  reports that he quit smoking about 11 years ago. He does not have any smokeless tobacco history on file. He reports that he drinks alcohol. His drug history is not on file.  No Known Allergies  Family History  Problem Relation Age of Onset  . Adopted: Yes     Prior to Admission medications   Medication Sig Start Date End Date Taking? Authorizing Provider  acetaminophen (TYLENOL) 650 MG CR tablet Take 650 mg by mouth daily as needed for pain.   Yes Historical Provider, MD  albuterol (PROVENTIL HFA;VENTOLIN HFA) 108 (90 BASE) MCG/ACT inhaler Inhale 2 puffs into the lungs every 6 (six) hours as needed for  wheezing or shortness of breath.    Yes Historical Provider, MD  aspirin 81 MG tablet Take 81 mg by mouth daily.   Yes Historical Provider, MD  atorvastatin (LIPITOR) 80 MG tablet Take 80 mg by mouth every evening.   Yes Historical Provider, MD  esomeprazole (NEXIUM) 40 MG capsule Take 40 mg by mouth daily before breakfast.   Yes Historical Provider, MD  finasteride (PROSCAR) 5 MG tablet Take 5 mg by mouth daily.   Yes Historical Provider, MD  fluticasone (FLONASE) 50 MCG/ACT nasal spray Place 2 sprays into both nostrils daily as needed for allergies.    Yes Historical Provider, MD  lisinopril (PRINIVIL,ZESTRIL) 10 MG tablet Take 10 mg by mouth daily.    Yes Historical Provider, MD  naproxen sodium (ANAPROX) 220 MG tablet Take 220 mg by mouth 2 (two) times daily with a meal.   Yes Historical Provider, MD  niacin (NIASPAN) 1000 MG CR tablet Take 1,000 mg by mouth at bedtime.   Yes Historical Provider, MD  nitroGLYCERIN (NITROSTAT) 0.4 MG SL tablet Place 0.4 mg under the tongue every 5 (five) minutes as needed for chest pain.    Yes Historical Provider, MD  Omega-3 Fatty Acids (FISH OIL) 1000 MG CAPS Take 1,000 mg by mouth 2 (two) times daily.  Yes Historical Provider, MD  omeprazole (PRILOSEC OTC) 20 MG tablet Take 20 mg by mouth daily.   Yes Historical Provider, MD   Physical Exam: Filed Vitals:   06/12/13 1552  BP:   Pulse:   Temp: 98.1 F (36.7 C)  Resp:     BP 127/75  Pulse 54  Temp(Src) 98.1 F (36.7 C) (Oral)  Resp 13  SpO2 99%  General:  Appears calm and comfortable Eyes: PERRL, normal lids, irises & conjunctiva ENT: grossly normal hearing, lips & tongue Neck: no LAD, masses or thyromegaly Cardiovascular: RRR, no m/r/g. No LE edema. Telemetry: brady Respiratory: CTA bilaterally, no w/r/r. Normal respiratory effort. Abdomen: soft, ntnd Skin: no rash or induration seen on limited exam Musculoskeletal: grossly normal tone BUE/BLE Psychiatric: grossly normal mood and  affect, speech fluent and appropriate Neurologic: grossly non-focal.          Labs on Admission:  Basic Metabolic Panel:  Recent Labs Lab 06/12/13 1237  NA 141  K 4.4  CL 107  CO2 22  GLUCOSE 111*  BUN 17  CREATININE 0.96  CALCIUM 8.9   Liver Function Tests:  Recent Labs Lab 06/12/13 1237  AST 36  ALT 35  ALKPHOS 98  BILITOT 0.4  PROT 7.4  ALBUMIN 4.0   No results found for this basename: LIPASE, AMYLASE,  in the last 168 hours No results found for this basename: AMMONIA,  in the last 168 hours CBC:  Recent Labs Lab 06/12/13 1237  WBC 6.2  NEUTROABS 3.9  HGB 15.4  HCT 44.9  MCV 92.4  PLT 185   Cardiac Enzymes: No results found for this basename: CKTOTAL, CKMB, CKMBINDEX, TROPONINI,  in the last 168 hours  BNP (last 3 results) No results found for this basename: PROBNP,  in the last 8760 hours CBG:  Recent Labs Lab 06/12/13 1249  GLUCAP 104*    Radiological Exams on Admission: Dg Chest 2 View  06/12/2013   CLINICAL DATA:  73 year old male chest pain and shortness of Breath. Initial encounter.  EXAM: CHEST  2 VIEW  COMPARISON:  06/06/2008.  FINDINGS: Stable lung volumes. Normal cardiac size and mediastinal contours. Visualized tracheal air column is within normal limits.  Stable subtle increased density at the overlap of the right first rib and clavicle. Right nipple shadow is present, but there is also nodular right lower lobe opacity located posterior and lateral to the nipple shadow on the frontal view. This measures about 18 mm diameter.  No pneumothorax, pulmonary edema, pleural effusion or consolidation. Left lung is stable. Stable visualized osseous structures.  IMPRESSION: 1. Right lower lobe 18 mm pulmonary nodule (with nearby nipple shadow). This might be related to infection, but if infection is not suspected clinically then recommend chest CT (IV contrast preferred) to further characterize. 2. No other acute cardiopulmonary abnormality.    Electronically Signed   By: Lars Pinks M.D.   On: 06/12/2013 14:30   Mr Brain Wo Contrast  06/12/2013   CLINICAL DATA:  Memory loss.  Thought disturbance.  EXAM: MRI HEAD WITHOUT CONTRAST  TECHNIQUE: Multiplanar, multiecho pulse sequences of the brain and surrounding structures were obtained without intravenous contrast.  COMPARISON:  Head CT 12/12/2003  FINDINGS: Diffusion imaging does not show any acute or subacute infarction. The brain does not show accelerated atrophy. There is no evidence of small or large vessel infarction. No mass lesion, hemorrhage, hydrocephalus or extra-axial collection. There is some fluid in the left frontal sinus. Other sinuses are clear. No  fluid in the mastoids. Flow is present in the major vessels at the base of the brain.  IMPRESSION: No acute infarction. Normal appearance of the brain for a person of this age.  Some fluid in the left frontal sinus.   Electronically Signed   By: Nelson Chimes M.D.   On: 06/12/2013 15:04    EKG: Independently reviewed. Sinus brady  Assessment/Plan Active Problems:   TIA (transient ischemic attack)   HTN (hypertension)  AMS- ? TIA- MRI negative, echo, carotid, ASA, FLP, HgbA1C  HTN- home meds, controlled  Bradycardia- not on rate lowering medications, monitor on tele  pulm nodule- former smoker, get CT of lung     Code Status:full Family Communication: family Disposition Plan: obs  Time spent: 35 min  [facility] Pager [phone]

## 2013-06-12 NOTE — ED Notes (Addendum)
Per pt sts that he woke up about 6 am this morning and went to work about 20 min till 7 and began feeling like something wasn't right with his memory. sts he feels better now. Denies any pain. sts he feels like he is thinking clearer than he was earlier this am. sts he is also a borderline diabetic and he ate a lot of chocolate last night. No slurred speech, facial droop, weakness or numbness in extremities.

## 2013-06-12 NOTE — ED Provider Notes (Signed)
CSN: [id number]     Arrival date & time 06/12/13  1217 History   First MD Initiated Contact with Patient 06/12/13 1314     Chief Complaint  Patient presents with  . Altered Mental Status      HPI Pt was seen at 1325. Per pt and his wife, c/o sudden onset and resolution of one episode of AMS/confusion that occurred this morning approx 0700. Pt states he was at work when he "just felt confused about what I was doing." Pt states he "couldn't remember how to do things I have been doing for a long time." Pt states he called his wife and left work. Pt's wife states pt "seemed to be thinking about what he was saying" and "couldn't get the words out."  Pt and his wife state his symptoms lasted approx 1 to 1.5 hours before resolving. Pt states he "feels fine now." Denies CP/palpitations, no SOB/cough, no abd pain, no N/V/D, no fevers, no visual changes, no focal motor weakness, no tingling/numbness in extremities, no facial droop, no slurred speech, no dysphagia.    Past Medical History  Diagnosis Date  . HTN (hypertension)   . HLD (hyperlipidemia)   . Esophageal stricture   . BPH (benign prostatic hypertrophy)   . Bronchospasm    Past Surgical History  Procedure Laterality Date  . Heel spur surgery  4/02  . Esophageal dilation  10/02  . Transurethral resection of prostate  6/02   Family History  Problem Relation Age of Onset  . Adopted: Yes   History  Substance Use Topics  . Smoking status: Former Smoker -- 0.50 packs/day for 35 years    Quit date: 01/07/2002  . Smokeless tobacco: Not on file  . Alcohol Use: Yes     Comment: occasionally    Review of Systems ROS: Statement: All systems negative except as marked or noted in the HPI; Constitutional: Negative for fever and chills. ; ; Eyes: Negative for eye pain, redness and discharge. ; ; ENMT: Negative for ear pain, hoarseness, nasal congestion, sinus pressure and sore throat. ; ; Cardiovascular: Negative for chest pain, palpitations,  diaphoresis, dyspnea and peripheral edema. ; ; Respiratory: Negative for cough, wheezing and stridor. ; ; Gastrointestinal: Negative for nausea, vomiting, diarrhea, abdominal pain, blood in stool, hematemesis, jaundice and rectal bleeding. . ; ; Genitourinary: Negative for dysuria, flank pain and hematuria. ; ; Musculoskeletal: Negative for back pain and neck pain. Negative for swelling and trauma.; ; Skin: Negative for pruritus, rash, abrasions, blisters, bruising and skin lesion.; ; Neuro: +confusion. Negative for headache, lightheadedness and neck stiffness. Negative for weakness, altered level of consciousness, extremity weakness, paresthesias, involuntary movement, seizure and syncope.      Allergies  Review of patient's allergies indicates no known allergies.  Home Medications   Prior to Admission medications   Medication Sig Start Date End Date Taking? Authorizing Provider  acetaminophen (TYLENOL) 650 MG CR tablet Take 650 mg by mouth daily as needed for pain.   Yes Historical Provider, MD  albuterol (PROVENTIL HFA;VENTOLIN HFA) 108 (90 BASE) MCG/ACT inhaler Inhale 2 puffs into the lungs every 6 (six) hours as needed for wheezing or shortness of breath.    Yes Historical Provider, MD  aspirin 81 MG tablet Take 81 mg by mouth daily.   Yes Historical Provider, MD  atorvastatin (LIPITOR) 80 MG tablet Take 80 mg by mouth every evening.   Yes Historical Provider, MD  esomeprazole (NEXIUM) 40 MG capsule Take 40 mg by mouth  daily before breakfast.   Yes Historical Provider, MD  finasteride (PROSCAR) 5 MG tablet Take 5 mg by mouth daily.   Yes Historical Provider, MD  fluticasone (FLONASE) 50 MCG/ACT nasal spray Place 2 sprays into both nostrils daily as needed for allergies.    Yes Historical Provider, MD  lisinopril (PRINIVIL,ZESTRIL) 10 MG tablet Take 10 mg by mouth daily.    Yes Historical Provider, MD  naproxen sodium (ANAPROX) 220 MG tablet Take 220 mg by mouth 2 (two) times daily with a  meal.   Yes Historical Provider, MD  niacin (NIASPAN) 1000 MG CR tablet Take 1,000 mg by mouth at bedtime.   Yes Historical Provider, MD  nitroGLYCERIN (NITROSTAT) 0.4 MG SL tablet Place 0.4 mg under the tongue every 5 (five) minutes as needed for chest pain.    Yes Historical Provider, MD  Omega-3 Fatty Acids (FISH OIL) 1000 MG CAPS Take 1,000 mg by mouth 2 (two) times daily.   Yes Historical Provider, MD  omeprazole (PRILOSEC OTC) 20 MG tablet Take 20 mg by mouth daily.   Yes Historical Provider, MD   BP 123/75  Pulse 55  Temp(Src) 98 F (36.7 C) (Oral)  Resp 20  SpO2 99% Physical Exam 1330: Physical examination:  Nursing notes reviewed; Vital signs and O2 SAT reviewed;  Constitutional: Well developed, Well nourished, Well hydrated, In no acute distress; Head:  Normocephalic, atraumatic; Eyes: EOMI, PERRL, No scleral icterus; ENMT: Mouth and pharynx normal, Mucous membranes moist; Neck: Supple, Full range of motion, No lymphadenopathy; Cardiovascular: Regular rate and rhythm, No gallop; Respiratory: Breath sounds clear & equal bilaterally, No rales, rhonchi, wheezes.  Speaking full sentences with ease, Normal respiratory effort/excursion; Chest: Nontender, Movement normal; Abdomen: Soft, Nontender, Nondistended, Normal bowel sounds; Genitourinary: No CVA tenderness; Extremities: Pulses normal, No tenderness, No edema, No calf edema or asymmetry.; Neuro: AA&Ox3, Major CN grossly intact.  Major CN grossly intact. Speech clear.  No facial droop.  No nystagmus. Grips equal. Strength 5/5 equal bilat UE's and LE's.  DTR 2/4 equal bilat UE's and LE's.  No gross sensory deficits.  Normal cerebellar testing bilat UE's (finger-nose) and LE's (heel-shin).; Skin: Color normal, Warm, Dry.    ED Course  Procedures     EKG Interpretation   Date/Time:  Wednesday Jun 12 2013 12:32:53 EDT Ventricular Rate:  53 PR Interval:  160 QRS Duration: 84 QT Interval:  408 QTC Calculation: 382 R Axis:   89 Text  Interpretation:  Sinus bradycardia Baseline wander Septal infarct ,  age undetermined Abnormal ECG When compared with ECG of 06/06/2008 No  significant change was found Confirmed by [facility]  MD, Nunzio Cory [phone])  on 06/12/2013 1:55:17 PM      MDM  MDM Reviewed: previous chart, nursing note and vitals Reviewed previous: labs and ECG Interpretation: labs, ECG, x-ray and MRI   Results for orders placed during the hospital encounter of 06/12/13  CBC      Result Value Ref Range   WBC 6.2  4.0 - 10.5 K/uL   RBC 4.86  4.22 - 5.81 MIL/uL   Hemoglobin 15.4  13.0 - 17.0 g/dL   HCT 44.9  39.0 - 52.0 %   MCV 92.4  78.0 - 100.0 fL   MCH 31.7  26.0 - 34.0 pg   MCHC 34.3  30.0 - 36.0 g/dL   RDW 13.8  11.5 - 15.5 %   Platelets 185  150 - 400 K/uL  DIFFERENTIAL      Result Value Ref Range  Neutrophils Relative % 62  43 - 77 %   Neutro Abs 3.9  1.7 - 7.7 K/uL   Lymphocytes Relative 26  12 - 46 %   Lymphs Abs 1.6  0.7 - 4.0 K/uL   Monocytes Relative 5  3 - 12 %   Monocytes Absolute 0.3  0.1 - 1.0 K/uL   Eosinophils Relative 6 (*) 0 - 5 %   Eosinophils Absolute 0.4  0.0 - 0.7 K/uL   Basophils Relative 1  0 - 1 %   Basophils Absolute 0.0  0.0 - 0.1 K/uL  COMPREHENSIVE METABOLIC PANEL      Result Value Ref Range   Sodium 141  137 - 147 mEq/L   Potassium 4.4  3.7 - 5.3 mEq/L   Chloride 107  96 - 112 mEq/L   CO2 22  19 - 32 mEq/L   Glucose, Bld 111 (*) 70 - 99 mg/dL   BUN 17  6 - 23 mg/dL   Creatinine, Ser 0.96  0.50 - 1.35 mg/dL   Calcium 8.9  8.4 - 10.5 mg/dL   Total Protein 7.4  6.0 - 8.3 g/dL   Albumin 4.0  3.5 - 5.2 g/dL   AST 36  0 - 37 U/L   ALT 35  0 - 53 U/L   Alkaline Phosphatase 98  39 - 117 U/L   Total Bilirubin 0.4  0.3 - 1.2 mg/dL   GFR calc non Af Amer 82 (*) >90 mL/min   GFR calc Af Amer >90  >90 mL/min  CBG MONITORING, ED      Result Value Ref Range   Glucose-Capillary 104 (*) 70 - 99 mg/dL   Comment 1 Notify RN     Comment 2 Documented in Chart    I-STAT  TROPOININ, ED      Result Value Ref Range   Troponin i, poc 0.00  0.00 - 0.08 ng/mL   Comment 3            Dg Chest 2 View 06/12/2013   CLINICAL DATA:  73 year old male chest pain and shortness of Breath. Initial encounter.  EXAM: CHEST  2 VIEW  COMPARISON:  06/06/2008.  FINDINGS: Stable lung volumes. Normal cardiac size and mediastinal contours. Visualized tracheal air column is within normal limits.  Stable subtle increased density at the overlap of the right first rib and clavicle. Right nipple shadow is present, but there is also nodular right lower lobe opacity located posterior and lateral to the nipple shadow on the frontal view. This measures about 18 mm diameter.  No pneumothorax, pulmonary edema, pleural effusion or consolidation. Left lung is stable. Stable visualized osseous structures.  IMPRESSION: 1. Right lower lobe 18 mm pulmonary nodule (with nearby nipple shadow). This might be related to infection, but if infection is not suspected clinically then recommend chest CT (IV contrast preferred) to further characterize. 2. No other acute cardiopulmonary abnormality.   Electronically Signed   By: Lars Pinks M.D.   On: 06/12/2013 14:30   Mr Brain Wo Contrast 06/12/2013   CLINICAL DATA:  Memory loss.  Thought disturbance.  EXAM: MRI HEAD WITHOUT CONTRAST  TECHNIQUE: Multiplanar, multiecho pulse sequences of the brain and surrounding structures were obtained without intravenous contrast.  COMPARISON:  Head CT 12/12/2003  FINDINGS: Diffusion imaging does not show any acute or subacute infarction. The brain does not show accelerated atrophy. There is no evidence of small or large vessel infarction. No mass lesion, hemorrhage, hydrocephalus or extra-axial collection. There is  some fluid in the left frontal sinus. Other sinuses are clear. No fluid in the mastoids. Flow is present in the major vessels at the base of the brain.  IMPRESSION: No acute infarction. Normal appearance of the brain for a person of  this age.  Some fluid in the left frontal sinus.   Electronically Signed   By: Nelson Chimes M.D.   On: 06/12/2013 15:04     1520:  MRI brain negative for acute CVA. Pt's neuro exam remains intact and unchanged. Will obtain CT chest to f/u pulmonary nodule on CXR. Dx and testing d/w pt and family.  Questions answered.  Verb understanding, agreeable to admit.  T/C to [facility] Dr. Eliseo Squires, case discussed, including:  HPI, pertinent PM/SHx, VS/PE, dx testing, ED course and treatment:  Agreeable to admit, requests to write temporary orders, obtain observation tele bed to team [id number].     Alfonzo Feller, DO 06/14/13 2040

## 2013-06-12 NOTE — ED Notes (Signed)
MD at bedside. 

## 2013-06-13 DIAGNOSIS — G459 Transient cerebral ischemic attack, unspecified: Secondary | ICD-10-CM

## 2013-06-13 LAB — LIPID PANEL
CHOL/HDL RATIO: 3 ratio
CHOLESTEROL: 144 mg/dL (ref 0–200)
HDL: 48 mg/dL (ref >39)
LDL CALC: 73 mg/dL (ref 0–99)
Triglycerides: 116 mg/dL (ref <150)
VLDL: 23 mg/dL (ref 0–40)

## 2013-06-13 LAB — GLUCOSE, CAPILLARY
GLUCOSE-CAPILLARY: 128 mg/dL — AB (ref 70–99)
Glucose-Capillary: 93 mg/dL (ref 70–99)
Glucose-Capillary: 96 mg/dL (ref 70–99)

## 2013-06-13 LAB — HEMOGLOBIN A1C
Hgb A1c MFr Bld: 6 % — ABNORMAL HIGH (ref <5.7)
Mean Plasma Glucose: 126 mg/dL — ABNORMAL HIGH (ref <117)

## 2013-06-13 NOTE — Progress Notes (Signed)
Echo Lab  2D Echocardiogram completed.  [place], RDCS 06/13/2013 3:21 PM

## 2013-06-13 NOTE — Discharge Summary (Addendum)
Todd Cummings, is a 73 y.o. male  DOB 1940/07/10  MRN [id number].  Admission date:  06/12/2013  Admitting Physician  [name], DO  Discharge Date:  06/13/2013   Primary MD  [name], MD  Recommendations for primary care physician for things to follow:   Monitor secondary to factors for TIA CVA   Admission Diagnosis  TIA (transient ischemic attack) [435.9] HTN (hypertension) [401.9] Pulmonary nodule [793.11] Pulmonary nodule seen on imaging study [793.11]   Discharge Diagnosis  TIA (transient ischemic attack) [435.9] HTN (hypertension) [401.9] Pulmonary nodule [793.11] Pulmonary nodule seen on imaging study [793.11]     Principal Problem:   TIA (transient ischemic attack) Active Problems:   HTN (hypertension)   Pulmonary nodule seen on imaging study      Past Medical History  Diagnosis Date  . HTN (hypertension)   . HLD (hyperlipidemia)   . Esophageal stricture   . BPH (benign prostatic hypertrophy)   . Bronchospasm     Past Surgical History  Procedure Laterality Date  . Heel spur surgery  4/02  . Esophageal dilation  10/02  . Transurethral resection of prostate  6/02     Discharge Condition: stable   Follow UP  Follow-up Information   Follow up with [name],[name], MD. Schedule an appointment as soon as possible for a visit in 1 week.   Specialty:  Internal Medicine   Contact information:   [place] [address] [phone]       Follow up with [facility]. Schedule an appointment as soon as possible for a visit in 1 month.   Contact information:   [address]     [place] [address] [phone]        Discharge Instructions  and  Discharge Medications      Discharge Orders   Future Appointments Provider Department Dept Phone     06/26/2013 4:00 PM [name], MD [facility] [phone]   Future Orders Complete By Expires   Discharge instructions  As directed    Scheduling Instructions:   Follow with Primary MD [name], MD in 7 days   Get CBC, CMP, checked 7 days by Primary MD and again as instructed by your Primary MD. Get a 2 view Chest X ray done next visit if you had Pneumonia of Lung problems at the Hospital.   Activity: As tolerated with Full fall precautions use walker/cane & assistance as needed   Disposition Home     Diet: Heart Healthy Low Carb.  For Heart failure patients - Check your Weight same time everyday, if you gain over 2 pounds, or you develop in leg swelling, experience more shortness of breath or chest pain, call your Primary MD immediately. Follow Cardiac Low Salt Diet and 1.8 lit/day fluid restriction.   On your next visit with her primary care physician please Get Medicines reviewed and adjusted.  Please request your Prim.MD to go over all Hospital Tests and Procedure/Radiological  results at the follow up, please get all Hospital records sent to your Prim MD by signing hospital release before you go home.   If you experience worsening of your admission symptoms, develop shortness of breath, life threatening emergency, suicidal or homicidal thoughts you must seek medical attention immediately by calling 911 or calling your MD immediately  if symptoms less severe.  You Must read complete instructions/literature along with all the possible adverse reactions/side effects for all the Medicines you take and that have been prescribed to you. Take any new Medicines after you have completely understood and accpet all the possible adverse reactions/side effects.   Do not drive and provide baby sitting services if your were admitted for syncope or siezures until you have seen by Primary MD or a Neurologist and advised to do so again.  Do not drive when taking Pain  medications.    Do not take more than prescribed Pain, Sleep and Anxiety Medications  Special Instructions: If you have smoked or chewed Tobacco  in the last 2 yrs please stop smoking, stop any regular Alcohol  and or any Recreational drug use.  Wear Seat belts while driving.   Please note  You were cared for by a hospitalist during your hospital stay. If you have any questions about your discharge medications or the care you received while you were in the hospital after you are discharged, you can call the unit and asked to speak with the hospitalist on call if the hospitalist that took care of you is not available. Once you are discharged, your primary care physician will handle any further medical issues. Please note that NO REFILLS for any discharge medications will be authorized once you are discharged, as it is imperative that you return to your primary care physician (or establish a relationship with a primary care physician if you do not have one) for your aftercare needs so that they can reassess your need for medications and monitor your lab values.   Increase activity slowly  As directed        Medication List         acetaminophen 650 MG CR tablet  Commonly known as:  TYLENOL  Take 650 mg by mouth daily as needed for pain.     albuterol 108 (90 BASE) MCG/ACT inhaler  Commonly known as:  PROVENTIL HFA;VENTOLIN HFA  Inhale 2 puffs into the lungs every 6 (six) hours as needed for wheezing or shortness of breath.     aspirin 81 MG tablet  Take 81 mg by mouth daily.     atorvastatin 80 MG tablet  Commonly known as:  LIPITOR  Take 80 mg by mouth every evening.     esomeprazole 40 MG capsule  Commonly known as:  NEXIUM  Take 40 mg by mouth daily before breakfast.     finasteride 5 MG tablet  Commonly known as:  PROSCAR  Take 5 mg by mouth daily.     Fish Oil 1000 MG Caps  Take 1,000 mg by mouth 2 (two) times daily.     fluticasone 50 MCG/ACT nasal spray  Commonly  known as:  FLONASE  Place 2 sprays into both nostrils daily as needed for allergies.     lisinopril 10 MG tablet  Commonly known as:  PRINIVIL,ZESTRIL  Take 10 mg by mouth daily.     naproxen sodium 220 MG tablet  Commonly known as:  ANAPROX  Take 220 mg by mouth 2 (two) times daily with a  meal.     niacin 1000 MG CR tablet  Commonly known as:  NIASPAN  Take 1,000 mg by mouth at bedtime.     nitroGLYCERIN 0.4 MG SL tablet  Commonly known as:  NITROSTAT  Place 0.4 mg under the tongue every 5 (five) minutes as needed for chest pain.     omeprazole 20 MG tablet  Commonly known as:  PRILOSEC OTC  Take 20 mg by mouth daily.          Diet and Activity recommendation: See Discharge Instructions above   Consults obtained - Neuro   Major procedures and Radiology Reports - PLEASE review detailed and final reports for all details, in brief -    Echo  Left ventricle: The cavity size was normal. Systolic function was normal. The estimated ejection fraction was in the range of 55% to 60%. Wall motion was normal; there were no regional wall motion abnormalities. Left ventricular diastolic function parameters were normal     Carotids  Preliminary report: 1-39% ICA stenosis. Vertebral artery flow is antegrade.     Dg Chest 2 View  06/12/2013   CLINICAL DATA:  73 year old male chest pain and shortness of Breath. Initial encounter.  EXAM: CHEST  2 VIEW  COMPARISON:  06/06/2008.  FINDINGS: Stable lung volumes. Normal cardiac size and mediastinal contours. Visualized tracheal air column is within normal limits.  Stable subtle increased density at the overlap of the right first rib and clavicle. Right nipple shadow is present, but there is also nodular right lower lobe opacity located posterior and lateral to the nipple shadow on the frontal view. This measures about 18 mm diameter.  No pneumothorax, pulmonary edema, pleural effusion or consolidation. Left lung is stable. Stable  visualized osseous structures.  IMPRESSION: 1. Right lower lobe 18 mm pulmonary nodule (with nearby nipple shadow). This might be related to infection, but if infection is not suspected clinically then recommend chest CT (IV contrast preferred) to further characterize. 2. No other acute cardiopulmonary abnormality.   Electronically Signed   By: Lars Pinks M.D.   On: 06/12/2013 14:30   Ct Chest W Contrast  06/12/2013   CLINICAL DATA:  Evaluate pulmonary nodule from chest x-ray  EXAM: CT CHEST WITH CONTRAST  TECHNIQUE: Multidetector CT imaging of the chest was performed during intravenous contrast administration.  CONTRAST:  152mL OMNIPAQUE IOHEXOL 300 MG/ML  SOLN  COMPARISON:  DG CHEST 2 VIEW dated 06/12/2013; DG CHEST 2 VIEW dated 06/06/2008  FINDINGS: There is mild diffuse centrilobular emphysematous change. There is no evidence of pulmonary nodule or mass. There is no pleural plaque or other focal nodular density over either lung. The nodular opacity seen on the chest radiograph likely represented a nipple shadow.  Thyroid is normal. There is an 11 mm short axis pretracheal lymph node. Other smaller lymph nodes are present in the mediastinum. No pericardial or pleural effusion identified.  Images through the upper abdomen degraded by motion. Small hiatal hernia. One or 2 tiny low-attenuation lesions in the inferior right lobe of the liver likely cysts although too small to characterize. Partially visualized 5 cm mass which appears to arise exophytic Truman Hayward off the right kidney, and the visualized portions of this finding are consistent with a simple cyst.  There are no acute musculoskeletal findings.  IMPRESSION: No pulmonary nodules identified. The density seen on chest radiograph likely represented a nipple shadow. There is mild diffuse emphysematous change.   Electronically Signed   By: Elodia Florence.D.  On: 06/12/2013 19:31   Mr Brain Wo Contrast  06/12/2013   CLINICAL DATA:  Memory loss.  Thought  disturbance.  EXAM: MRI HEAD WITHOUT CONTRAST  TECHNIQUE: Multiplanar, multiecho pulse sequences of the brain and surrounding structures were obtained without intravenous contrast.  COMPARISON:  Head CT 12/12/2003  FINDINGS: Diffusion imaging does not show any acute or subacute infarction. The brain does not show accelerated atrophy. There is no evidence of small or large vessel infarction. No mass lesion, hemorrhage, hydrocephalus or extra-axial collection. There is some fluid in the left frontal sinus. Other sinuses are clear. No fluid in the mastoids. Flow is present in the major vessels at the base of the brain.  IMPRESSION: No acute infarction. Normal appearance of the brain for a person of this age.  Some fluid in the left frontal sinus.   Electronically Signed   By: Paulina Fusi M.D.   On: 06/12/2013 15:04    Micro Results      No results found for this or any previous visit (from the past 240 hour(s)).   History of present illness and  Hospital Course:     Kindly see H&P for history of present illness and admission details, please review complete Labs, Consult reports and Test reports for all details in brief Todd Cummings, is a 73 y.o. male, patient with history of  hypertension, dyslipidemia, who was in his usual state of health was admitted with an episode of forgetfulness which lasted almost 8-9 hrs, not associated with any focal deficits, MRI brain and CT brain unremarkable, A1c lipid panel stable, all symptoms resolved, will be seen by neurology outpatient.    For his other chronic medical problems which include hypertension, dyslipidemia, dysphagia stricture, BPH he will resume his home medications unchanged. Post discharge he will follow with PCP and stroke clinic in the outpatient setting.     Today   Subjective:   Todd Cummings today has no headache,no chest abdominal pain,no new weakness tingling or numbness, feels much better wants to go home today.    Objective:   Blood  pressure 120/73, pulse 53, temperature 97.8 F (36.6 C), temperature source Oral, resp. rate 16, height 5' 10.5" (1.791 m), weight 101.061 kg (222 lb 12.8 oz), SpO2 97.00%.   Intake/Output Summary (Last 24 hours) at 06/13/13 1038 Last data filed at 06/13/13 0755  Gross per 24 hour  Intake    840 ml  Output      0 ml  Net    840 ml    Exam Awake Alert, Oriented *3, No new F.N deficits, Normal affect [place].AT,PERRAL Supple Neck,No JVD, No cervical lymphadenopathy appriciated.  Symmetrical Chest wall movement, Good air movement bilaterally, CTAB RRR,No Gallops,Rubs or new Murmurs, No Parasternal Heave +ve B.Sounds, Abd Soft, Non tender, No organomegaly appriciated, No rebound -guarding or rigidity. No Cyanosis, Clubbing or edema, No new Rash or bruise  Data Review   Lab Results  Component Value Date   HGBA1C 6.0* 06/12/2013   Lab Results  Component Value Date   CHOL 144 06/13/2013   HDL 48 06/13/2013   LDLCALC 73 06/13/2013   TRIG 116 06/13/2013   CHOLHDL 3.0 06/13/2013     CBC w Diff: Lab Results  Component Value Date   WBC 6.2 06/12/2013   HGB 15.4 06/12/2013   HCT 44.9 06/12/2013   PLT 185 06/12/2013   LYMPHOPCT 26 06/12/2013   MONOPCT 5 06/12/2013   EOSPCT 6* 06/12/2013   BASOPCT 1 06/12/2013  CMP: Lab Results  Component Value Date   NA 141 06/12/2013   K 4.4 06/12/2013   CL 107 06/12/2013   CO2 22 06/12/2013   BUN 17 06/12/2013   CREATININE 0.96 06/12/2013   PROT 7.4 06/12/2013   ALBUMIN 4.0 06/12/2013   BILITOT 0.4 06/12/2013   ALKPHOS 98 06/12/2013   AST 36 06/12/2013   ALT 35 06/12/2013  .   Total Time in preparing paper work, data evaluation and todays exam - 35 minutes  Thurnell Lose M.D on 06/13/2013 at 10:38 AM  [facility] Office  [phone]

## 2013-06-13 NOTE — Discharge Instructions (Signed)
Follow with Primary MD [name], MD in 7 days   Get CBC, CMP, checked 7 days by Primary MD and again as instructed by your Primary MD    Activity: As tolerated with Full fall precautions use walker/cane & assistance as needed   Disposition Home    Diet: Heart Healthy Low carb.  For Heart failure patients - Check your Weight same time everyday, if you gain over 2 pounds, or you develop in leg swelling, experience more shortness of breath or chest pain, call your Primary MD immediately. Follow Cardiac Low Salt Diet and 1.8 lit/day fluid restriction.   On your next visit with her primary care physician please Get Medicines reviewed and adjusted.  Please request your Prim.MD to go over all Hospital Tests and Procedure/Radiological results at the follow up, please get all Hospital records sent to your Prim MD by signing hospital release before you go home.   If you experience worsening of your admission symptoms, develop shortness of breath, life threatening emergency, suicidal or homicidal thoughts you must seek medical attention immediately by calling 911 or calling your MD immediately  if symptoms less severe.  You Must read complete instructions/literature along with all the possible adverse reactions/side effects for all the Medicines you take and that have been prescribed to you. Take any new Medicines after you have completely understood and accpet all the possible adverse reactions/side effects.   Do not drive and provide baby sitting services if your were admitted for syncope or siezures until you have seen by Primary MD or a Neurologist and advised to do so again.  Do not drive when taking Pain medications.    Do not take more than prescribed Pain, Sleep and Anxiety Medications  Special Instructions: If you have smoked or chewed Tobacco  in the last 2 yrs please stop smoking, stop any regular Alcohol  and or any Recreational drug use.  Wear Seat belts while  driving.   Please note  You were cared for by a hospitalist during your hospital stay. If you have any questions about your discharge medications or the care you received while you were in the hospital after you are discharged, you can call the unit and asked to speak with the hospitalist on call if the hospitalist that took care of you is not available. Once you are discharged, your primary care physician will handle any further medical issues. Please note that NO REFILLS for any discharge medications will be authorized once you are discharged, as it is imperative that you return to your primary care physician (or establish a relationship with a primary care physician if you do not have one) for your aftercare needs so that they can reassess your need for medications and monitor your lab values.

## 2013-06-13 NOTE — Progress Notes (Signed)
VASCULAR LAB PRELIMINARY  PRELIMINARY  PRELIMINARY  PRELIMINARY  Carotid Dopplers completed.    Preliminary report:  1-39% ICA stenosis.  Vertebral artery flow is antegrade.  Iantha Fallen, RVT 06/13/2013, 3:06 PM

## 2013-06-13 NOTE — Plan of Care (Signed)
Problem: Discharge/Transitional Outcomes Goal: Family and patient agree upon discharge plan Outcome: Completed/Met Date Met:  06/13/13 Wife knows all of the procedures /her job and she also knows all s/s of stroke and TIA information was still given in handouts

## 2013-06-13 NOTE — Progress Notes (Signed)
UR completed 

## 2013-06-26 ENCOUNTER — Ambulatory Visit (INDEPENDENT_AMBULATORY_CARE_PROVIDER_SITE_OTHER): Payer: Medicare Other | Admitting: Cardiovascular Disease

## 2013-06-26 ENCOUNTER — Encounter: Payer: Self-pay | Admitting: Cardiovascular Disease

## 2013-06-26 VITALS — BP 110/78 | HR 62 | Ht 70.5 in | Wt 221.0 lb

## 2013-06-26 DIAGNOSIS — G459 Transient cerebral ischemic attack, unspecified: Secondary | ICD-10-CM

## 2013-06-26 NOTE — Patient Instructions (Signed)
Your physician recommends that you continue on your current medications as directed. Please refer to the Current Medication list given to you today.  Your physician wants you to follow-up in: 1 year with Dr. Nahser.  You will receive a reminder letter in the mail two months in advance. If you don't receive a letter, please call our office to schedule the follow-up appointment.  

## 2013-06-26 NOTE — Progress Notes (Signed)
[name] Date of Birth  1940-03-20       [facility]    [employer] [address], [address], [place], [place]  [address]   [place], [place]  [address] [address]   Fax  [phone]     Fax [phone]  Problem List: 1. Hypertension 2. Transient ischemic attack  History of Present Illness:  Dijuan  was recently admitted to the hospital with a TIA. His echocardiogram revealed normal left ventricle systolic function.  Head MRI was unremarkable.   He is feeling well now.   Carotid doppler were negative.   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  Plavix was added to his medication list.  He is still active -    Current Outpatient Prescriptions on File Prior to Visit  Medication Sig Dispense Refill  . acetaminophen (TYLENOL) 650 MG CR tablet Take 650 mg by mouth daily as needed for pain.      Marland Kitchen aspirin 81 MG tablet Take 81 mg by mouth daily.      Marland Kitchen atorvastatin (LIPITOR) 80 MG tablet Take 80 mg by mouth every evening.      . finasteride (PROSCAR) 5 MG tablet Take 5 mg by mouth daily.      . fluticasone (FLONASE) 50 MCG/ACT nasal spray Place 2 sprays into both nostrils daily as needed for allergies.       Marland Kitchen lisinopril (PRINIVIL,ZESTRIL) 10 MG tablet Take 10 mg by mouth daily.       . naproxen sodium (ANAPROX) 220 MG tablet Take 220 mg by mouth 2 (two) times daily with a meal.      . niacin (NIASPAN) 1000 MG CR tablet Take 1,000 mg by mouth at bedtime.      . nitroGLYCERIN (NITROSTAT) 0.4 MG SL tablet Place 0.4 mg under the tongue every 5 (five) minutes as needed for chest pain.       . Omega-3 Fatty Acids (FISH OIL) 1000 MG CAPS Take 1,000 mg by mouth 2 (two) times daily.      Marland Kitchen omeprazole (PRILOSEC OTC) 20 MG tablet Take 20 mg by mouth daily.       No current facility-administered medications on file prior to visit.    No Known Allergies  Past Medical History  Diagnosis Date  . HTN  (hypertension)   . HLD (hyperlipidemia)   . Esophageal stricture   . BPH (benign prostatic hypertrophy)   . Bronchospasm     Past Surgical History  Procedure Laterality Date  . Heel spur surgery  4/02  . Esophageal dilation  10/02  . Transurethral resection of prostate  6/02    History  Smoking status  . Former Smoker -- 0.50 packs/day for 35 years  . Quit date: 01/07/2002  Smokeless tobacco  . Not on file    History  Alcohol Use  . Yes    Comment: occasionally    Family History  Problem Relation Age of Onset  . Adopted: Yes    Reviw of Systems:  Reviewed in the HPI.  All other systems are negative.  Physical Exam: Blood pressure 110/78, pulse 62, height 5' 10.5" (1.791 m), weight 221 lb (100.245 kg). Wt Readings from Last 3 Encounters:  06/26/13 221 lb (100.245 kg)  06/12/13 222 lb 12.8 oz (101.061 kg)     General: Well developed, well nourished, in no acute distress.  Head: Normocephalic, atraumatic, sclera non-icteric,  mucus membranes are moist,   Neck: Supple. Carotids are 2 + without bruits. No JVD   Lungs: Clear   Heart: RR, normal S1S2  Abdomen: Soft, non-tender, non-distended with normal bowel sounds.  Msk:  Strength and tone are normal   Extremities: No clubbing or cyanosis. No edema.  Distal pedal pulses are 2+ and equal   Neuro: CN II - XII intact.  Alert and oriented X 3.   Psych:  Normal  ECG:  Assessment / Plan:

## 2013-06-26 NOTE — Assessment & Plan Note (Signed)
Von  was admitted with a TIA. He's done very well. He's not had any further neurologic symptoms. His echocardiogram is normal. I discussed the fact that his neurologist may want for him to have a TEE. All of the neurologist decided whether or not he needs it.  From my standpoint he is doing well. I would recommend continuing the Plavix and aspirin for now. I'll see him again in one year. His blood pressure is quite good.

## 2013-08-08 DIAGNOSIS — R310 Gross hematuria: Secondary | ICD-10-CM | POA: Diagnosis not present

## 2013-08-08 DIAGNOSIS — N400 Enlarged prostate without lower urinary tract symptoms: Secondary | ICD-10-CM | POA: Diagnosis not present

## 2013-08-30 DIAGNOSIS — N529 Male erectile dysfunction, unspecified: Secondary | ICD-10-CM | POA: Diagnosis not present

## 2013-08-30 DIAGNOSIS — M549 Dorsalgia, unspecified: Secondary | ICD-10-CM | POA: Diagnosis not present

## 2013-08-30 DIAGNOSIS — Z79899 Other long term (current) drug therapy: Secondary | ICD-10-CM | POA: Diagnosis not present

## 2013-08-30 DIAGNOSIS — E119 Type 2 diabetes mellitus without complications: Secondary | ICD-10-CM | POA: Diagnosis not present

## 2013-08-30 DIAGNOSIS — M1990 Unspecified osteoarthritis, unspecified site: Secondary | ICD-10-CM | POA: Diagnosis not present

## 2013-08-30 DIAGNOSIS — I10 Essential (primary) hypertension: Secondary | ICD-10-CM | POA: Diagnosis not present

## 2013-08-30 DIAGNOSIS — Z125 Encounter for screening for malignant neoplasm of prostate: Secondary | ICD-10-CM | POA: Diagnosis not present

## 2013-08-30 DIAGNOSIS — Z0000 Encounter for general adult medical examination without abnormal findings: Secondary | ICD-10-CM | POA: Diagnosis not present

## 2013-08-30 DIAGNOSIS — M109 Gout, unspecified: Secondary | ICD-10-CM | POA: Diagnosis not present

## 2013-09-04 DIAGNOSIS — Z1212 Encounter for screening for malignant neoplasm of rectum: Secondary | ICD-10-CM | POA: Diagnosis not present

## 2013-09-30 ENCOUNTER — Telehealth: Payer: Self-pay | Admitting: *Deleted

## 2013-09-30 NOTE — Telephone Encounter (Signed)
Called patient left voice mail letting him know he needs to see Dr. Erlinda Hong, instead of lynn asked him to return my call.

## 2013-09-30 NOTE — Telephone Encounter (Signed)
Talked with patient he has been rescheduled.

## 2013-10-02 ENCOUNTER — Ambulatory Visit: Payer: Self-pay | Admitting: Nurse Practitioner

## 2013-10-04 ENCOUNTER — Ambulatory Visit (INDEPENDENT_AMBULATORY_CARE_PROVIDER_SITE_OTHER): Payer: Medicare Other | Admitting: Neurology

## 2013-10-04 ENCOUNTER — Encounter: Payer: Self-pay | Admitting: Neurology

## 2013-10-04 VITALS — BP 123/71 | HR 48 | Ht 69.5 in | Wt 229.8 lb

## 2013-10-04 DIAGNOSIS — G9340 Encephalopathy, unspecified: Secondary | ICD-10-CM

## 2013-10-04 DIAGNOSIS — I10 Essential (primary) hypertension: Secondary | ICD-10-CM

## 2013-10-04 NOTE — Patient Instructions (Signed)
-   the episode does not sound to me like TIA but more transient encephalopathy, could be due to hypertension, dehydration, stress, electrolyte imbalance - OK to discontinue plavix and start on baby ASA 81mg  daily since you complaint of easy brusing and you are on aleeve twice a day and you prefer to go back to ASA - check BP at home closely (twice a day for a week or two) to get the baseline, goal 120-140.  - recommend regular exercise and weight loss.  - follow up with PCP regularly for stroke risk factor modification. - follow up PRN

## 2013-10-04 NOTE — Progress Notes (Signed)
NEUROLOGY CLINIC NEW PATIENT NOTE  NAME: Todd Cummings DOB: May 10, 1940  I saw Todd Cummings as a new patient in the neurovascular clinic today regarding  Chief Complaint  Patient presents with  . Cerebrovascular Accident    2 month f/u #8  .  HPI: Todd Cummings is a 73 y.o. male with PMH of HTN and HLD on lisinopril who presents as a new patient for a confusion episode 3 months ago. He stated that he was driving to a very familiar place on 06/12/13. However, during the driving, he had a one episode of confusion that he cannot remember where the place is, he had to pull over and think about it. This lasted about 5-10 minutes and resolved. He later went to see a nurse and checked BP which was 185/90 which was high for him. He talked to his wife and was advised to ER. He was admitted and work up including MRI, 2-D echo, carotid Doppler, A1c and lipids panel which were all negative. He is aspirin was discontinued, and he was discharged and Plavix.  Since discharge, he was doing well. He is no recurrent symptoms. His blood pressure home controlled well, around 140. Today in clinic his blood pressure 123/72. He continued taking Plavix, and he complains of easy bruising. He has not had much bruising with previous aspirin taking. Of note, he is on Aleev 20 mg twice a day.   He denies any history of diabetes, carotid disease. He had a sick study done about a year ago, which was negative. He quit smoking 8 years ago. He denies heavy alcohol and illicit drugs.  Past Medical History  Diagnosis Date  . HTN (hypertension)   . HLD (hyperlipidemia)   . Esophageal stricture   . BPH (benign prostatic hypertrophy)   . Bronchospasm    Past Surgical History  Procedure Laterality Date  . Heel spur surgery  4/02  . Esophageal dilation  10/02  . Transurethral resection of prostate  6/02   Family History  Problem Relation Age of Onset  . Adopted: Yes  . Family history unknown: Yes   Current Outpatient  Prescriptions  Medication Sig Dispense Refill  . acetaminophen (TYLENOL) 650 MG CR tablet Take 650 mg by mouth daily as needed for pain.      Marland Kitchen atorvastatin (LIPITOR) 80 MG tablet Take 80 mg by mouth every evening.      . clopidogrel (PLAVIX) 75 MG tablet       . finasteride (PROSCAR) 5 MG tablet Take 5 mg by mouth daily.      . fluticasone (FLONASE) 50 MCG/ACT nasal spray Place 2 sprays into both nostrils daily as needed for allergies.       Marland Kitchen lisinopril (PRINIVIL,ZESTRIL) 10 MG tablet Take 10 mg by mouth daily.       . naproxen sodium (ANAPROX) 220 MG tablet Take 220 mg by mouth 2 (two) times daily with a meal.      . niacin (NIASPAN) 1000 MG CR tablet Take 1,000 mg by mouth at bedtime.      . nitroGLYCERIN (NITROSTAT) 0.4 MG SL tablet Place 0.4 mg under the tongue every 5 (five) minutes as needed for chest pain.       . Omega-3 Fatty Acids (FISH OIL) 1000 MG CAPS Take 1,000 mg by mouth 2 (two) times daily.      Marland Kitchen omeprazole (PRILOSEC OTC) 20 MG tablet Take 20 mg by mouth daily.  No current facility-administered medications for this visit.   No Known Allergies History   Social History  . Marital Status: Married    Spouse Name: N/A    Number of Children: 3  . Years of Education: COLLEGE   Occupational History  . contractor    Social History Main Topics  . Smoking status: Former Smoker -- 0.50 packs/day for 35 years    Quit date: 01/07/2002  . Smokeless tobacco: Not on file  . Alcohol Use: Yes     Comment: occasionally  . Drug Use: No  . Sexual Activity: Yes   Other Topics Concern  . Not on file   Social History Narrative   Patient lives at home with his family    Patient right Handed   Patient drink decaffeinated drinks    Review of Systems Full 14 system review of systems performed and notable only for those listed, all others are neg:  Constitutional: N/A  Cardiovascular: N/A  Ear/Nose/Throat: N/A  Skin: N/A  Eyes: N/A  Respiratory: N/A  Gastroitestinal:  N/A  Hematology/Lymphatic: Easy bruising  Endocrine: N/A  Musculoskeletal: N/A  Allergy/Immunology: N/A  Neurological: N/A  Psychiatric: N/A   Physical Exam  Filed Vitals:   10/04/13 1129  BP: 123/71  Pulse: 48    General - Well nourished, well developed, in no apparent distress.  Ophthalmologic - Sharp disc margins OU.  Cardiovascular - Regular rate and rhythm with no murmur. Carotid pulses were 2+ without bruits .   Neck - supple, no nuchal rigidity .  Mental Status -  Level of arousal and orientation to time, place, and person were intact. Language including expression, naming, repetition, comprehension, reading, and writing was assessed and found intact. Attention span and concentration were normal. Recent and remote memory were 3/3 registration and 2/3 delayed recall. Fund of Knowledge was assessed and was intact, know 4/5 past president.  Cranial Nerves II - XII - II - Visual field intact OU. III, IV, VI - Extraocular movements intact. V - Facial sensation intact bilaterally. VII - Facial movement intact bilaterally. VIII - Hearing & vestibular intact bilaterally. X - Palate elevates symmetrically. XI - Chin turning & shoulder shrug intact bilaterally. XII - Tongue protrusion intact.  Motor Strength - The patient's strength was normal in all extremities and pronator drift was absent.  Bulk was normal and fasciculations were absent.   Motor Tone - Muscle tone was assessed at the neck and appendages and was normal.  Reflexes - The patient's reflexes were normal in all extremities and he had no pathological reflexes.  Sensory - Light touch, temperature/pinprick, vibration and proprioception, and Romberg testing were assessed and were normal.    Coordination - The patient had normal movements in the hands and feet with no ataxia or dysmetria.  Tremor was absent.  Gait and Station - The patient's transfers, posture, gait, station, and turns were observed as  normal.   Imaging MRI brain 06/12/13 - No acute infarction. Normal appearance of the brain for a person of  this age. Some fluid in the left frontal sinus.  CT chest 06/12/13 -  No pulmonary nodules identified. The density seen on chest  radiograph likely represented a nipple shadow. There is mild diffuse  emphysematous change.   2D echo - Left ventricle: The cavity size was normal. Systolic function was normal. The estimated ejection fraction was in the range of 55% to 60%. Wall motion was normal; there were no regional wall motion abnormalities. Left ventricular diastolic  function parameters were normal.  CUS - Right: mild focal calcific plaque origin ICA. Left: mild mixed plaque origin ICA. Bilateral:1-39% ICA stenosis. Vertebral artery flow is antegrade. ICA/CCA ratio: R-1.21 L-1.19  Lab Review Component     Latest Ref Rng 06/12/2013 06/13/2013  Cholesterol     0 - 200 mg/dL  144  Triglycerides     <150 mg/dL  116  HDL     >39 mg/dL  48  Total CHOL/HDL Ratio       3.0  VLDL     0 - 40 mg/dL  23  LDL (calc)     0 - 99 mg/dL  73  Hemoglobin A1C     <5.7 % 6.0 (H)   Mean Plasma Glucose     <117 mg/dL 126 (H)     Assessments: NIHSS: 0 Rankin: 0   Assessment and Plan:   In summary, Todd Cummings is a 73 y.o. male with PMH of HTN and HLD followup today in clinic for his hospitalization 3 months ago. He had one confusion episode 3 months ago, when he confused about the familiar place he was supposed to go, lasting about 5-10 minutes. No focal neurological deficit. Stroke workup negative in hospital. I do not highly suspect the event to be TIA, instead I more favor patient had transient encephalopathy, likely related to hypertension, or dehydration, stress, electrolyte imbalance, etc. he complains of easy bruising on Plavix as well as aleeve bid, and he preferred to go back to aspirin. I'm okay to discontinue Plavix and put him back on a baby aspirin. He need to continue blood  pressure control and the followup with primary Dr. for stroke risk factor modification.  - OK to discontinue plavix and start on baby ASA 81mg  daily  - check BP at home closely goal 120-140.  - recommend regular exercise and weight loss.  - follow up with PCP regularly for stroke risk factor modification. - RTC PRN  I recommend aggressive blood pressure control with a goal <130/80 mm Hg.  Lipids should be managed intensively, with a goal LDL < 70 mg/dL.  I encouraged the patient to discuss these important issues with his primary care physician.  I counseled the patient on measures to reduce stroke risk, including the importance of medication compliance, risk factor control, exercise, healthy diet, and avoidance of smoking.  I reviewed stroke warning signs and symptoms and appropriate actions to take if such occurs.   Thank you very much for the opportunity to participate in the care of this patient.  Please do not hesitate to call if any questions or concerns arise.  No orders of the defined types were placed in this encounter.    Patient Instructions  - the episode does not sound to me like TIA but more transient encephalopathy, could be due to hypertension, dehydration, stress, electrolyte imbalance - OK to discontinue plavix and start on baby ASA 81mg  daily since you complaint of easy brusing and you are on aleeve twice a day and you prefer to go back to ASA - check BP at home closely (twice a day for a week or two) to get the baseline, goal 120-140.  - recommend regular exercise and weight loss.  - follow up with PCP regularly for stroke risk factor modification. - follow up PRN   Rosalin Hawking, MD PhD [facility] [address], [place] [address] [place], [place] [address] [phone]

## 2014-01-17 DIAGNOSIS — H2513 Age-related nuclear cataract, bilateral: Secondary | ICD-10-CM | POA: Diagnosis not present

## 2014-01-17 DIAGNOSIS — H2511 Age-related nuclear cataract, right eye: Secondary | ICD-10-CM | POA: Diagnosis not present

## 2014-03-14 DIAGNOSIS — R7301 Impaired fasting glucose: Secondary | ICD-10-CM | POA: Diagnosis not present

## 2014-07-04 DIAGNOSIS — L820 Inflamed seborrheic keratosis: Secondary | ICD-10-CM | POA: Diagnosis not present

## 2014-07-04 DIAGNOSIS — Z85828 Personal history of other malignant neoplasm of skin: Secondary | ICD-10-CM | POA: Diagnosis not present

## 2014-07-04 DIAGNOSIS — L308 Other specified dermatitis: Secondary | ICD-10-CM | POA: Diagnosis not present

## 2014-07-04 DIAGNOSIS — L812 Freckles: Secondary | ICD-10-CM | POA: Diagnosis not present

## 2014-07-04 DIAGNOSIS — D485 Neoplasm of uncertain behavior of skin: Secondary | ICD-10-CM | POA: Diagnosis not present

## 2014-07-04 DIAGNOSIS — L918 Other hypertrophic disorders of the skin: Secondary | ICD-10-CM | POA: Diagnosis not present

## 2014-07-04 DIAGNOSIS — L821 Other seborrheic keratosis: Secondary | ICD-10-CM | POA: Diagnosis not present

## 2014-07-04 DIAGNOSIS — D225 Melanocytic nevi of trunk: Secondary | ICD-10-CM | POA: Diagnosis not present

## 2014-07-04 DIAGNOSIS — L218 Other seborrheic dermatitis: Secondary | ICD-10-CM | POA: Diagnosis not present

## 2014-07-28 DIAGNOSIS — M545 Low back pain: Secondary | ICD-10-CM | POA: Diagnosis not present

## 2014-08-27 DIAGNOSIS — M545 Low back pain: Secondary | ICD-10-CM | POA: Diagnosis not present

## 2014-10-17 DIAGNOSIS — I10 Essential (primary) hypertension: Secondary | ICD-10-CM | POA: Diagnosis not present

## 2014-10-17 DIAGNOSIS — E785 Hyperlipidemia, unspecified: Secondary | ICD-10-CM | POA: Diagnosis not present

## 2014-10-17 DIAGNOSIS — M109 Gout, unspecified: Secondary | ICD-10-CM | POA: Diagnosis not present

## 2014-10-17 DIAGNOSIS — E119 Type 2 diabetes mellitus without complications: Secondary | ICD-10-CM | POA: Diagnosis not present

## 2014-10-17 DIAGNOSIS — Z125 Encounter for screening for malignant neoplasm of prostate: Secondary | ICD-10-CM | POA: Diagnosis not present

## 2014-10-24 DIAGNOSIS — Z6831 Body mass index (BMI) 31.0-31.9, adult: Secondary | ICD-10-CM | POA: Diagnosis not present

## 2014-10-24 DIAGNOSIS — Z0000 Encounter for general adult medical examination without abnormal findings: Secondary | ICD-10-CM | POA: Diagnosis not present

## 2014-10-24 DIAGNOSIS — I10 Essential (primary) hypertension: Secondary | ICD-10-CM | POA: Diagnosis not present

## 2014-10-24 DIAGNOSIS — Z23 Encounter for immunization: Secondary | ICD-10-CM | POA: Diagnosis not present

## 2014-10-24 DIAGNOSIS — M1990 Unspecified osteoarthritis, unspecified site: Secondary | ICD-10-CM | POA: Diagnosis not present

## 2014-10-24 DIAGNOSIS — E119 Type 2 diabetes mellitus without complications: Secondary | ICD-10-CM | POA: Diagnosis not present

## 2014-10-24 DIAGNOSIS — M109 Gout, unspecified: Secondary | ICD-10-CM | POA: Diagnosis not present

## 2014-10-24 DIAGNOSIS — N529 Male erectile dysfunction, unspecified: Secondary | ICD-10-CM | POA: Diagnosis not present

## 2014-10-24 DIAGNOSIS — J439 Emphysema, unspecified: Secondary | ICD-10-CM | POA: Diagnosis not present

## 2014-10-24 DIAGNOSIS — K219 Gastro-esophageal reflux disease without esophagitis: Secondary | ICD-10-CM | POA: Diagnosis not present

## 2014-10-24 DIAGNOSIS — E785 Hyperlipidemia, unspecified: Secondary | ICD-10-CM | POA: Diagnosis not present

## 2014-10-24 DIAGNOSIS — G459 Transient cerebral ischemic attack, unspecified: Secondary | ICD-10-CM | POA: Diagnosis not present

## 2014-11-10 DIAGNOSIS — Z1212 Encounter for screening for malignant neoplasm of rectum: Secondary | ICD-10-CM | POA: Diagnosis not present

## 2014-12-16 DIAGNOSIS — R310 Gross hematuria: Secondary | ICD-10-CM | POA: Diagnosis not present

## 2014-12-16 DIAGNOSIS — N400 Enlarged prostate without lower urinary tract symptoms: Secondary | ICD-10-CM | POA: Diagnosis not present

## 2015-01-09 DIAGNOSIS — Z1211 Encounter for screening for malignant neoplasm of colon: Secondary | ICD-10-CM | POA: Diagnosis not present

## 2015-01-09 DIAGNOSIS — K640 First degree hemorrhoids: Secondary | ICD-10-CM | POA: Diagnosis not present

## 2015-01-09 DIAGNOSIS — K5730 Diverticulosis of large intestine without perforation or abscess without bleeding: Secondary | ICD-10-CM | POA: Diagnosis not present

## 2015-01-09 DIAGNOSIS — D124 Benign neoplasm of descending colon: Secondary | ICD-10-CM | POA: Diagnosis not present

## 2015-01-15 ENCOUNTER — Encounter (HOSPITAL_COMMUNITY): Payer: Self-pay | Admitting: *Deleted

## 2015-01-15 ENCOUNTER — Other Ambulatory Visit: Payer: Self-pay | Admitting: Gastroenterology

## 2015-01-15 NOTE — Addendum Note (Signed)
Addended by: Vernie Ammons. on: 01/15/2015 02:01 PM   Modules accepted: Orders

## 2015-01-15 NOTE — Progress Notes (Signed)
Pt denies cardiac history, chest pain or sob. He states he was given NTG "years ago" by his PCP "just in case I need it". He states he's never had to use it.  EKG- 06/13/13 in [facility] Echo- 06/13/13 in [facility] Stress test- 06/16/08 in [facility].

## 2015-01-16 ENCOUNTER — Ambulatory Visit (HOSPITAL_COMMUNITY): Payer: Medicare Other | Admitting: Certified Registered"

## 2015-01-16 ENCOUNTER — Encounter (HOSPITAL_COMMUNITY)
Admission: RE | Disposition: A | Discharge status: Home / Self Care | Payer: Self-pay | Source: Ambulatory Visit | Attending: Gastroenterology

## 2015-01-16 ENCOUNTER — Encounter (HOSPITAL_COMMUNITY): Payer: Self-pay | Admitting: *Deleted

## 2015-01-16 ENCOUNTER — Ambulatory Visit (HOSPITAL_COMMUNITY)
Admission: RE | Admit: 2015-01-16 | Discharge: 2015-01-16 | Disposition: A | Discharge status: Home / Self Care | Payer: Medicare Other | Source: Ambulatory Visit | Attending: Gastroenterology | Admitting: Gastroenterology

## 2015-01-16 ENCOUNTER — Ambulatory Visit (HOSPITAL_COMMUNITY): Payer: Medicare Other

## 2015-01-16 DIAGNOSIS — M1990 Unspecified osteoarthritis, unspecified site: Secondary | ICD-10-CM | POA: Insufficient documentation

## 2015-01-16 DIAGNOSIS — Z7982 Long term (current) use of aspirin: Secondary | ICD-10-CM | POA: Diagnosis not present

## 2015-01-16 DIAGNOSIS — R1310 Dysphagia, unspecified: Secondary | ICD-10-CM

## 2015-01-16 DIAGNOSIS — Z79899 Other long term (current) drug therapy: Secondary | ICD-10-CM | POA: Diagnosis not present

## 2015-01-16 DIAGNOSIS — Z87891 Personal history of nicotine dependence: Secondary | ICD-10-CM | POA: Insufficient documentation

## 2015-01-16 DIAGNOSIS — I10 Essential (primary) hypertension: Secondary | ICD-10-CM | POA: Insufficient documentation

## 2015-01-16 DIAGNOSIS — K222 Esophageal obstruction: Secondary | ICD-10-CM | POA: Insufficient documentation

## 2015-01-16 DIAGNOSIS — K219 Gastro-esophageal reflux disease without esophagitis: Secondary | ICD-10-CM | POA: Diagnosis not present

## 2015-01-16 DIAGNOSIS — Z7902 Long term (current) use of antithrombotics/antiplatelets: Secondary | ICD-10-CM | POA: Insufficient documentation

## 2015-01-16 DIAGNOSIS — Z791 Long term (current) use of non-steroidal anti-inflammatories (NSAID): Secondary | ICD-10-CM | POA: Diagnosis not present

## 2015-01-16 DIAGNOSIS — N400 Enlarged prostate without lower urinary tract symptoms: Secondary | ICD-10-CM | POA: Insufficient documentation

## 2015-01-16 DIAGNOSIS — Z8673 Personal history of transient ischemic attack (TIA), and cerebral infarction without residual deficits: Secondary | ICD-10-CM | POA: Insufficient documentation

## 2015-01-16 HISTORY — PX: SAVORY DILATION: SHX5439

## 2015-01-16 HISTORY — PX: ESOPHAGOGASTRODUODENOSCOPY (EGD) WITH PROPOFOL: SHX5813

## 2015-01-16 HISTORY — DX: Unspecified osteoarthritis, unspecified site: M19.90

## 2015-01-16 HISTORY — DX: Gastro-esophageal reflux disease without esophagitis: K21.9

## 2015-01-16 HISTORY — DX: Calculus of kidney: N20.0

## 2015-01-16 SURGERY — ESOPHAGOGASTRODUODENOSCOPY (EGD) WITH PROPOFOL
Anesthesia: Monitor Anesthesia Care

## 2015-01-16 MED ORDER — PROPOFOL 10 MG/ML IV BOLUS
INTRAVENOUS | Status: DC | PRN
  Administered 2015-01-16 (×2): 10 mg via INTRAVENOUS

## 2015-01-16 MED ORDER — LACTATED RINGERS IV SOLN
INTRAVENOUS | Status: DC
  Administered 2015-01-16: 1000 mL via INTRAVENOUS

## 2015-01-16 MED ORDER — PROPOFOL 500 MG/50ML IV EMUL
INTRAVENOUS | Status: DC | PRN
  Administered 2015-01-16: 150 ug/kg/min via INTRAVENOUS

## 2015-01-16 MED ORDER — LACTATED RINGERS IV SOLN
INTRAVENOUS | Status: DC | PRN
  Administered 2015-01-16: 10:00:00 via INTRAVENOUS

## 2015-01-16 MED ORDER — SODIUM CHLORIDE 0.9 % IV SOLN: INTRAVENOUS | Status: DC

## 2015-01-16 MED ORDER — BUTAMBEN-TETRACAINE-BENZOCAINE 2-2-14 % EX AERO
INHALATION_SPRAY | CUTANEOUS | Status: DC | PRN
  Administered 2015-01-16: 1 via TOPICAL

## 2015-01-16 NOTE — H&P (Signed)
Subjective:   Patient is a 74 y.o. male presents with dysphagia need for esophageal dilatation. He has been violated about every 5 years last 2011. At that time he was dilated to 15 mm. He has had some increasing problems with dysphagia. He had a colonoscopy last week it was uneventful.. Procedure including risks and benefits discussed in office.  Patient Active Problem List   Diagnosis Date Noted  . Encephalopathy, unspecified 10/04/2013  . Essential hypertension 10/04/2013  . TIA (transient ischemic attack) 06/12/2013  . HTN (hypertension) 06/12/2013  . Pulmonary nodule seen on imaging study 06/12/2013   Past Medical History  Diagnosis Date  . HTN (hypertension)   . HLD (hyperlipidemia)   . Esophageal stricture   . BPH (benign prostatic hypertrophy)   . Bronchospasm   . Kidney stones   . GERD (gastroesophageal reflux disease)   . Arthritis     Past Surgical History  Procedure Laterality Date  . Heel spur surgery  4/02  . Esophageal dilation  10/02  . Transurethral resection of prostate  6/02  . Colonoscopy      Prescriptions prior to admission  Medication Sig Dispense Refill Last Dose  . acetaminophen (TYLENOL) 650 MG CR tablet Take 650 mg by mouth daily as needed for pain.   01/15/2015 at Unknown time  . aspirin 81 MG tablet Take 81 mg by mouth at bedtime.   01/14/2015 at Unknown time  . atorvastatin (LIPITOR) 80 MG tablet Take 80 mg by mouth every evening.   Past Week at Unknown time  . finasteride (PROSCAR) 5 MG tablet Take 5 mg by mouth daily.   01/15/2015 at Unknown time  . fluticasone (FLONASE) 50 MCG/ACT nasal spray Place 2 sprays into both nostrils daily as needed for allergies.    01/15/2015 at Unknown time  . lisinopril (PRINIVIL,ZESTRIL) 10 MG tablet Take 10 mg by mouth daily.    01/15/2015 at Unknown time  . naproxen sodium (ANAPROX) 220 MG tablet Take 220 mg by mouth 2 (two) times daily with a meal.   01/15/2015 at Unknown time  . niacin (NIASPAN) 1000 MG CR tablet  Take 1,000 mg by mouth at bedtime.   Past Week at Unknown time  . Omega-3 Fatty Acids (FISH OIL) 1000 MG CAPS Take 1,000 mg by mouth 2 (two) times daily.   01/15/2015 at Unknown time  . omeprazole (PRILOSEC OTC) 20 MG tablet Take 20 mg by mouth daily.   Past Week at Unknown time  . clopidogrel (PLAVIX) 75 MG tablet    More than a month at Unknown time  . nitroGLYCERIN (NITROSTAT) 0.4 MG SL tablet Place 0.4 mg under the tongue every 5 (five) minutes as needed for chest pain.    Taking   No Known Allergies  Social History  Substance Use Topics  . Smoking status: Former Smoker -- 0.50 packs/day for 35 years    Quit date: 01/07/2002  . Smokeless tobacco: Former Systems developer  . Alcohol Use: Yes     Comment: occasionally    Family History  Problem Relation Age of Onset  . Adopted: Yes  . Family history unknown: Yes     Objective:   Patient Vitals for the past 8 hrs:  BP Temp Temp src Resp SpO2 Height Weight  01/16/15 0812 136/72 mmHg 98.2 F (36.8 C) Oral 14 96 % 5' 10.5" (1.791 m) 100.84 kg (222 lb 5 oz)         See MD Preop evaluation  Assessment:   1. Dysphagia with reflux stricture  Plan:   We will proceed with dilatation as before. This is been discussed with the patient including the risk of bleeding and perforation.

## 2015-01-16 NOTE — Discharge Instructions (Addendum)
Continue current meds. Clear liquids for 4-6 hours, if no chest pain or trouble breathing, soft foods tonight.  Regular diet tomorrow. Call for problems.   Esophagogastroduodenoscopy, Care After Refer to this sheet in the next few weeks. These instructions provide you with information about caring for yourself after your procedure. Your health care provider may also give you more specific instructions. Your treatment has been planned according to current medical practices, but problems sometimes occur. Call your health care provider if you have any problems or questions after your procedure. WHAT TO EXPECT AFTER THE PROCEDURE After your procedure, it is typical to feel:  Soreness in your throat.  Pain with swallowing.  Sick to your stomach (nauseous).  Bloated.  Dizzy.  Fatigued. HOME CARE INSTRUCTIONS  Do not eat or drink anything until the numbing medicine (local anesthetic) has worn off and your gag reflex has returned. You will know that the local anesthetic has worn off when you can swallow comfortably.  Do not drive or operate machinery until directed by your health care provider.  Take medicines only as directed by your health care provider. SEEK MEDICAL CARE IF:   You cannot stop coughing.  You are not urinating at all or less than usual. SEEK IMMEDIATE MEDICAL CARE IF:  You have difficulty swallowing.  You cannot eat or drink.  You have worsening throat or chest pain.  You have dizziness or lightheadedness or you faint.  You have nausea or vomiting.  You have chills.  You have a fever.  You have severe abdominal pain.  You have black, tarry, or bloody stools.   This information is not intended to replace advice given to you by your health care provider. Make sure you discuss any questions you have with your health care provider.     Document Released: 01/11/2012 Document Revised: 02/14/2014 Document Reviewed: 01/11/2012 Elsevier Interactive Patient  Education [employer].

## 2015-01-16 NOTE — Op Note (Signed)
[facility] [place], [address]   ENDOSCOPY PROCEDURE REPORT  PATIENT: Todd Cummings, Todd Cummings  MR#: [id number] BIRTHDATE: Mar 28, 1940 , 74  yrs. old GENDER: male ENDOSCOPIST:Brielle Moro, MD REFERRED BY: Dr Joylene Draft PROCEDURE DATE:  01/16/2015 PROCEDURE:   EGD with Savary dilatation ASA CLASS:    class II INDICATIONS: patient has had history of esophageal reflux with intermittent dysphagia with 2 prior dilatation's over the past 10 years each to 15 mm. He has continued to have some problems with solid foods and desire another dilatation. Risk and benefits have been discussed in detail with him. MEDICATION: propofol 175 mg IV TOPICAL ANESTHETIC:   Cetacaine Spray  DESCRIPTION OF PROCEDURE:   After the risks and benefits of the procedure were explained, informed consent was obtained.  The PENTAX GASTOROSCOPE [id number]  endoscope was introduced through the mouth  and advanced to the second portion of the duodenum .  The instrument was slowly withdrawn as the mucosa was fully examined. Estimated blood loss is zero unless otherwise noted in this procedure report. there was mild duodenitis without Frank ulceration. Using fluoroscopic guidance, Savary guide wire was placed to the scope and the scope withdrawn over the guide wire in the usual manner. With the patient's neck extended weekend past 14 mm and 15 mm dilators with no heme. The 15 mm dilators guide wire were removed as a unit. There were no immediate complications. The stomach was examined in the forward and retro flex view with no other significant findings.    The scope was then withdrawn from the patient and the procedure completed.  COMPLICATIONS: There were no immediate complications.  ENDOSCOPIC IMPRESSION: 1. Minimal esophageal stricture. Dilated to 15 mm RECOMMENDATIONS: routine post dilatation orders. Will recommend dilating again in the future as  needed.   _______________________________ Lorrin MaisLaurence Spates, MD 01/16/2015 10:07 AM     cc: Dr. Joylene Draft  CPT CODES: ICD CODES:  The ICD and CPT codes recommended by this software are interpretations from the data that the clinical staff has captured with the software.  The verification of the translation of this report to the ICD and CPT codes and modifiers is the sole responsibility of the health care institution and practicing physician where this report was generated.  [employer] will not be held responsible for the validity of the ICD and CPT codes included on this report.  AMA assumes no liability for data contained or not contained herein. CPT is a Designer, television/film set of the [employer].

## 2015-01-16 NOTE — Transfer of Care (Signed)
Immediate Anesthesia Transfer of Care Note  Patient: Todd Cummings  Procedure(s) Performed: Procedure(s): ESOPHAGOGASTRODUODENOSCOPY (EGD) WITH PROPOFOL (N/A) SAVORY DILATION (N/A)  Patient Location: Endoscopy Unit  Anesthesia Type:MAC  Level of Consciousness: awake, alert  and oriented  Airway & Oxygen Therapy: Patient Spontanous Breathing and Patient connected to nasal cannula oxygen  Post-op Assessment: Report given to RN, Post -op Vital signs reviewed and stable and Patient moving all extremities X 4  Post vital signs: Reviewed and stable  Last Vitals:  Filed Vitals:   01/16/15 0812  BP: 136/72  Temp: 36.8 C  Resp: 14    Complications: No apparent anesthesia complications

## 2015-01-16 NOTE — Anesthesia Postprocedure Evaluation (Signed)
Anesthesia Post Note  Patient: Todd Cummings  Procedure(s) Performed: Procedure(s) (LRB): ESOPHAGOGASTRODUODENOSCOPY (EGD) WITH PROPOFOL (N/A) SAVORY DILATION (N/A)  Patient location during evaluation: Endoscopy Anesthesia Type: MAC Level of consciousness: awake Pain management: pain level controlled Vital Signs Assessment: post-procedure vital signs reviewed and stable Respiratory status: spontaneous breathing Cardiovascular status: stable Postop Assessment: no signs of nausea or vomiting Anesthetic complications: no    Last Vitals:  Filed Vitals:   01/16/15 1020 01/16/15 1030  BP: 157/87 147/77  Pulse: 47 47  Temp:    Resp: 12 15    Last Pain: There were no vitals filed for this visit.               Paola Flynt

## 2015-01-16 NOTE — Anesthesia Preprocedure Evaluation (Signed)
Anesthesia Evaluation  Patient identified by MRN, date of birth, ID band Patient awake    Reviewed: Allergy & Precautions, NPO status , Patient's Chart, lab work & pertinent test results  History of Anesthesia Complications Negative for: history of anesthetic complications  Airway Mallampati: II  TM Distance: >3 FB Neck ROM: Full    Dental  (+) Teeth Intact   Pulmonary neg shortness of breath, neg sleep apnea, neg COPD, neg recent URI, former smoker,    breath sounds clear to auscultation       Cardiovascular hypertension, Pt. on medications (-) angina+CHF  (-) Past MI  Rhythm:Regular     Neuro/Psych neg Seizures TIAnegative psych ROS   GI/Hepatic Neg liver ROS, GERD  Medicated and Controlled,  Endo/Other  negative endocrine ROS  Renal/GU negative Renal ROS     Musculoskeletal  (+) Arthritis ,   Abdominal   Peds  Hematology negative hematology ROS (+)   Anesthesia Other Findings   Reproductive/Obstetrics                             Anesthesia Physical Anesthesia Plan  ASA: II  Anesthesia Plan: MAC   Post-op Pain Management:    Induction: Intravenous  Airway Management Planned: Nasal Cannula, Natural Airway and Simple Face Mask  Additional Equipment:   Intra-op Plan:   Post-operative Plan:   Informed Consent: I have reviewed the patients History and Physical, chart, labs and discussed the procedure including the risks, benefits and alternatives for the proposed anesthesia with the patient or authorized representative who has indicated his/her understanding and acceptance.   Dental advisory given  Plan Discussed with: CRNA and Surgeon  Anesthesia Plan Comments:         Anesthesia Quick Evaluation

## 2015-01-19 ENCOUNTER — Encounter (HOSPITAL_COMMUNITY): Payer: Self-pay | Admitting: Gastroenterology

## 2015-01-23 DIAGNOSIS — H2513 Age-related nuclear cataract, bilateral: Secondary | ICD-10-CM | POA: Diagnosis not present

## 2015-06-22 DIAGNOSIS — H43812 Vitreous degeneration, left eye: Secondary | ICD-10-CM | POA: Diagnosis not present

## 2015-12-25 DIAGNOSIS — R350 Frequency of micturition: Secondary | ICD-10-CM | POA: Diagnosis not present

## 2015-12-25 DIAGNOSIS — N401 Enlarged prostate with lower urinary tract symptoms: Secondary | ICD-10-CM | POA: Diagnosis not present

## 2016-08-16 ENCOUNTER — Telehealth: Payer: Self-pay | Admitting: Cardiology

## 2016-08-16 ENCOUNTER — Ambulatory Visit (INDEPENDENT_AMBULATORY_CARE_PROVIDER_SITE_OTHER): Payer: Medicare Other | Admitting: Cardiology

## 2016-08-16 ENCOUNTER — Encounter: Payer: Self-pay | Admitting: Cardiology

## 2016-08-16 VITALS — BP 140/80 | HR 62 | Ht 70.0 in | Wt 230.0 lb

## 2016-08-16 DIAGNOSIS — Z6833 Body mass index (BMI) 33.0-33.9, adult: Secondary | ICD-10-CM | POA: Diagnosis not present

## 2016-08-16 DIAGNOSIS — E669 Obesity, unspecified: Secondary | ICD-10-CM | POA: Diagnosis not present

## 2016-08-16 DIAGNOSIS — I10 Essential (primary) hypertension: Secondary | ICD-10-CM | POA: Diagnosis not present

## 2016-08-16 DIAGNOSIS — R0602 Shortness of breath: Secondary | ICD-10-CM

## 2016-08-16 NOTE — Telephone Encounter (Signed)
Received records from [facility] for appointment on 08/16/16 with Dr Percival Spanish.  Records put with Dr Hochrein's schedule for 08/16/16. lp

## 2016-08-16 NOTE — Progress Notes (Signed)
Cardiology Office Note   Date:  08/17/2016   ID:  Todd Cummings, DOB 02/08/1940, MRN [id number]  PCP:  Crist Infante, MD  Cardiologist:   Minus Breeding, MD  Referring:  Crist Infante, MD  Chief Complaint  Patient presents with  . Shortness of Breath      History of Present Illness: Todd Cummings is a 76 y.o. male who is referred by Crist Infante, MD for SOB.  He said this happened a couple times over the last week. He got hot. He had been working extensively in the yard.  He noted that night that he went to bed and woke up couple of hours later short of breath. He didn't do anything about it sat up and watch TV. The next morning the same thing happened. He noted that his blood pressure was high around that time and actually started taking 20 mg of lisinopril and started using a Breath Rite strip.  He said that he also saw his primary provider and I reviewed these records.   When he saw his primary care provider is lisinopril was changed to 10 mg twice daily and was given Advair. He says his breathing is better since then. His blood pressures are still elevated with systolics in the 478-295 range. He denies any ongoing cardiovascular symptoms below.  The patient denies any new symptoms such as chest discomfort, neck or arm discomfort. There has been no new shortness of breath, PND or orthopnea. There have been no reported palpitations, presyncope or syncope.  He was seen greater than 3 years ago by Dr. Acie Fredrickson.   He had a TIA.    He had a normal echo.    Other evaluation included a negative perfusion study in 2010.    Past Medical History:  Diagnosis Date  . Arthritis   . BPH (benign prostatic hypertrophy)   . Bronchospasm   . Esophageal stricture   . GERD (gastroesophageal reflux disease)   . HLD (hyperlipidemia)   . HTN (hypertension)   . Kidney stones     Past Surgical History:  Procedure Laterality Date  . CATARACT EXTRACTION    . COLONOSCOPY    . ESOPHAGEAL DILATION  10/02  .  ESOPHAGOGASTRODUODENOSCOPY (EGD) WITH PROPOFOL N/A 01/16/2015   Procedure: ESOPHAGOGASTRODUODENOSCOPY (EGD) WITH PROPOFOL;  Surgeon: Laurence Spates, MD;  Location: [facility];  Service: Endoscopy;  Laterality: N/A;  . HEEL SPUR SURGERY  4/02  . SAVORY DILATION N/A 01/16/2015   Procedure: SAVORY DILATION;  Surgeon: Laurence Spates, MD;  Location: [facility];  Service: Endoscopy;  Laterality: N/A;  . TRANSURETHRAL RESECTION OF PROSTATE  6/02     Current Outpatient Prescriptions  Medication Sig Dispense Refill  . acetaminophen (TYLENOL) 650 MG CR tablet Take 650 mg by mouth daily as needed for pain.    Marland Kitchen aspirin 81 MG tablet Take 81 mg by mouth at bedtime.    Marland Kitchen atorvastatin (LIPITOR) 80 MG tablet Take 80 mg by mouth every evening.    . finasteride (PROSCAR) 5 MG tablet Take 5 mg by mouth daily.    . fluticasone (FLONASE) 50 MCG/ACT nasal spray Place 2 sprays into both nostrils daily as needed for allergies.     Marland Kitchen lisinopril (PRINIVIL,ZESTRIL) 10 MG tablet Take 10 mg by mouth daily.     . naproxen sodium (ANAPROX) 220 MG tablet Take 220 mg by mouth 2 (two) times daily with a meal.    . niacin (NIASPAN) 1000 MG CR tablet Take 1,000  mg by mouth at bedtime.    . nitroGLYCERIN (NITROSTAT) 0.4 MG SL tablet Place 0.4 mg under the tongue every 5 (five) minutes as needed for chest pain.     . Omega-3 Fatty Acids (FISH OIL) 1000 MG CAPS Take 1,000 mg by mouth 2 (two) times daily.    Marland Kitchen omeprazole (PRILOSEC OTC) 20 MG tablet Take 20 mg by mouth daily.     No current facility-administered medications for this visit.     Allergies:   Patient has no known allergies.    Social History:  The patient  reports that he quit smoking about 14 years ago. He has a 17.50 pack-year smoking history. He has quit using smokeless tobacco. He reports that he drinks alcohol. He reports that he does not use drugs.   Family History:  The patient's He was adopted. Family history is unknown by patient.    ROS:  Please see  the history of present illness.   Otherwise, review of systems are positive for none.   All other systems are reviewed and negative.    PHYSICAL EXAM: VS:  BP 140/80   Pulse 62   Ht 5\' 10"  (1.778 m)   Wt 230 lb (104.3 kg)   BMI 33.00 kg/m  , BMI Body mass index is 33 kg/m. GENERAL:  Well appearing HEENT:  Pupils equal round and reactive, fundi not visualized, oral mucosa unremarkable NECK:  No jugular venous distention, waveform within normal limits, carotid upstroke brisk and symmetric, no bruits, no thyromegaly LYMPHATICS:  No cervical, inguinal adenopathy LUNGS:  Clear to auscultation bilaterally BACK:  No CVA tenderness CHEST:  Unremarkable HEART:  PMI not displaced or sustained,S1 and S2 within normal limits, no S3, no S4, no clicks, no rubs, no murmurs ABD:  Flat, positive bowel sounds normal in frequency in pitch, no bruits, no rebound, no guarding, no midline pulsatile mass, no hepatomegaly, no splenomegaly EXT:  2 plus pulses throughout, no edema, no cyanosis no clubbing SKIN:  No rashes no nodules NEURO:  Cranial nerves II through XII grossly intact, motor grossly intact throughout PSYCH:  Cognitively intact, oriented to person place and time    EKG:  EKG is  ordered today. The ekg ordered today demonstratSinus rhythm, rate 62, axis within normal limits, intervals within normal limits, poor anterior R wave progression, nonspecific T-wave flattening.   Recent Labs: No results found for requested labs within last 8760 hours.    Lipid Panel    Component Value Date/Time   CHOL 144 06/13/2013 0434   TRIG 116 06/13/2013 0434   HDL 48 06/13/2013 0434   CHOLHDL 3.0 06/13/2013 0434   VLDL 23 06/13/2013 0434   LDLCALC 73 06/13/2013 0434      Wt Readings from Last 3 Encounters:  08/16/16 230 lb (104.3 kg)  01/16/15 222 lb 5 oz (100.8 kg)  10/04/13 229 lb 12.8 oz (104.2 kg)      Other studies Reviewed: Additional studies/ records that were reviewed today include:  Office I reviewed the images from his previous perfusion study records.  . Review of the above records demonstrates:  Please see elsewhere in the note.     ASSESSMENT AND PLAN:  SOB:  This could be an anginal equivalent. I am going to screen him with a  POET (Plain Old Exercise Treadmill) .  I will need for the BP to be slightly lower and will try to schedule this for one week from now.  HTN:  I instructed him on  a blood pressure diary and also told about salt restriction and weight loss. I gave him a well.   HYPERGLYCEMIA:  His A1C was slightly elevated in the past.  I reviewed this along with diet and exercise instructions.    OVERWEIGHT:  Educated as above.       Current medicines are reviewed at length with the patient today.  The patient does not have concerns regarding medicines.  The following changes have been made:  no change  Labs/ tests ordered today include:   Orders Placed This Encounter  Procedures  . EXERCISE TOLERANCE TEST  . EKG 12-Lead     Disposition:   FU with me as needed.      Signed, Minus Breeding, MD  08/17/2016 2:00 PM    [place]

## 2016-08-16 NOTE — Patient Instructions (Signed)
Medication Instructions:  Continue current medication  Labwork: None Ordered  Testing/Procedures: Your physician has requested that you have an exercise tolerance test. For further information please visit [email or web address]. Please also follow instruction sheet, as given.   Follow-Up: Your physician recommends that you schedule a follow-up appointment in: As Needed   Any Other Special Instructions Will Be Listed Below (If Applicable).     If you need a refill on your cardiac medications before your next appointment, please call your pharmacy.

## 2016-08-17 ENCOUNTER — Encounter: Payer: Self-pay | Admitting: Cardiology

## 2016-08-17 DIAGNOSIS — Z6833 Body mass index (BMI) 33.0-33.9, adult: Secondary | ICD-10-CM

## 2016-08-17 DIAGNOSIS — E669 Obesity, unspecified: Secondary | ICD-10-CM | POA: Insufficient documentation

## 2016-08-17 DIAGNOSIS — R0602 Shortness of breath: Secondary | ICD-10-CM | POA: Insufficient documentation

## 2016-08-19 ENCOUNTER — Ambulatory Visit (INDEPENDENT_AMBULATORY_CARE_PROVIDER_SITE_OTHER): Payer: Medicare Other

## 2016-08-19 DIAGNOSIS — R0602 Shortness of breath: Secondary | ICD-10-CM

## 2016-08-22 ENCOUNTER — Encounter: Payer: Self-pay | Admitting: Cardiology

## 2016-08-22 LAB — EXERCISE TOLERANCE TEST
CSEPED: 6 min
CSEPHR: 98 %
CSEPPHR: 144 {beats}/min
Estimated workload: 7 METS
Exercise duration (sec): 0 s
MPHR: 147 {beats}/min
RPE: 15
Rest HR: 65 {beats}/min

## 2016-08-24 ENCOUNTER — Telehealth: Payer: Self-pay | Admitting: *Deleted

## 2016-08-24 DIAGNOSIS — R0602 Shortness of breath: Secondary | ICD-10-CM

## 2016-08-24 NOTE — Telephone Encounter (Signed)
-----   Message from Minus Breeding, MD sent at 08/23/2016  6:15 PM EDT ----- Equivocal POET (Plain Old Exercise Treadmill).  Needs to have a [employer].  Patient aware of results.  Send to Crist Infante, MD.  Please schedule the test and call the patient.

## 2016-08-24 NOTE — Telephone Encounter (Signed)
lexiscan Myoview ordered and send to scheduler to be schedule

## 2016-08-25 ENCOUNTER — Telehealth (HOSPITAL_COMMUNITY): Payer: Self-pay

## 2016-08-25 NOTE — Telephone Encounter (Signed)
Encounter complete. 

## 2016-08-26 ENCOUNTER — Ambulatory Visit (HOSPITAL_COMMUNITY)
Admission: RE | Admit: 2016-08-26 | Discharge: 2016-08-26 | Disposition: A | Discharge status: Home / Self Care | Payer: Medicare Other | Source: Ambulatory Visit | Attending: Cardiovascular Disease | Admitting: Cardiovascular Disease

## 2016-08-26 DIAGNOSIS — R0602 Shortness of breath: Secondary | ICD-10-CM | POA: Insufficient documentation

## 2016-08-26 LAB — MYOCARDIAL PERFUSION IMAGING
CHL CUP NUCLEAR SDS: 0
CHL CUP NUCLEAR SRS: 2
CHL CUP NUCLEAR SSS: 2
LV dias vol: 96 mL (ref 62–150)
LVSYSVOL: 48 mL
NUC STRESS TID: 1.05
Peak HR: 82 {beats}/min
Rest HR: 50 {beats}/min

## 2016-08-26 MED ORDER — REGADENOSON 0.4 MG/5ML IV SOLN
0.4000 mg | Freq: Once | INTRAVENOUS | Status: AC
  Administered 2016-08-26: 0.4 mg via INTRAVENOUS

## 2016-08-26 MED ORDER — TECHNETIUM TC 99M TETROFOSMIN IV KIT
31.2000 | PACK | Freq: Once | INTRAVENOUS | Status: AC | PRN
  Administered 2016-08-26: 31.2 via INTRAVENOUS
  Filled 2016-08-26: qty 32

## 2016-08-26 MED ORDER — TECHNETIUM TC 99M TETROFOSMIN IV KIT
10.3000 | PACK | Freq: Once | INTRAVENOUS | Status: AC | PRN
  Administered 2016-08-26: 10.3 via INTRAVENOUS
  Filled 2016-08-26: qty 11

## 2017-04-07 DEATH — deceased

## 2018-06-21 NOTE — Progress Notes (Signed)
Virtual Visit via Video Note   This visit type was conducted due to national recommendations for restrictions regarding the COVID-19 Pandemic (e.g. social distancing) in an effort to limit this patient's exposure and mitigate transmission in our community.  Due to his co-morbid illnesses, this patient is at least at moderate risk for complications without adequate follow up.  This format is felt to be most appropriate for this patient at this time.  All issues noted in this document were discussed and addressed.  A limited physical exam was performed with this format.  Please refer to the patient's chart for his consent to telehealth for [facility].   Date:  06/22/2018   ID:  Todd Cummings, DOB [birth year], MRN [id number]  Patient Location: Home Provider Location: Home  PCP:  Crist Infante, MD  Cardiologist:  Minus Breeding, MD  Electrophysiologist:  None   Evaluation Performed:  Follow-Up Visit  Chief Complaint:  SOB  History of Present Illness:    Todd Cummings is a 78 y.o. male with SOB.  I saw him in 2018 for this and he had a negative perfusion study.    He was sent by Dr. Silvestre Mesi office for evaluation of shortness of breath.  He had pneumonia which.  He got antibiotics and did get better.  However, he had a return of shortness more recently.  He was seen on the 11th at his primary office and had another chest x-ray with probably some residual pneumonia.  He was given another course of antibiotics.  I reviewed these records.  He has gone home and is describing continued shortness of breath.  This happens more at night when he tries to lay down.  He thought it was related to mowing the lawn and coming in and that having breathlessness the next night.  He will lie down.  Few hours later he might wake up.  Is not gasping or distress.  Gets up to watch TV.  He is not describing chest pressure, neck or arm discomfort.  He is not describing any new cough fevers or chills.  He was tested for  coronavirus and was negative.  Of note I went back to the note from 2 years ago and he described exactly the same event including the lawnmower and getting up and watching TV.  At that time he had a stress test that was negative.  He is had an echocardiogram in 2015 that was unremarkable.  He was seen for some symptoms in 2010 with a negative stress test.  He has had a little bit of left leg edema but no significant change in his weight.  He has had no palpitations, presyncope or syncope.  He was able to walk a mile couple days ago without bringing on any of the symptoms.   The patient does not have symptoms concerning for COVID-19 infection (fever, chills, cough, or new shortness of breath).    Past Medical History:  Diagnosis Date   Arthritis    BPH (benign prostatic hypertrophy)    Bronchospasm    Esophageal stricture    GERD (gastroesophageal reflux disease)    HLD (hyperlipidemia)    HTN (hypertension)    Kidney stones    Past Surgical History:  Procedure Laterality Date   CATARACT EXTRACTION     COLONOSCOPY     ESOPHAGEAL DILATION  10/02   ESOPHAGOGASTRODUODENOSCOPY (EGD) WITH PROPOFOL N/A 01/16/2015   Procedure: ESOPHAGOGASTRODUODENOSCOPY (EGD) WITH PROPOFOL;  Surgeon: Laurence Spates, MD;  Location: [facility]  ENDOSCOPY;  Service: Endoscopy;  Laterality: N/A;   HEEL SPUR SURGERY  4/02   SAVORY DILATION N/A 01/16/2015   Procedure: SAVORY DILATION;  Surgeon: Laurence Spates, MD;  Location: [facility];  Service: Endoscopy;  Laterality: N/A;   TRANSURETHRAL RESECTION OF PROSTATE  6/02     Current Meds  Medication Sig   acetaminophen (TYLENOL) 650 MG CR tablet Take 650 mg by mouth daily as needed for pain.   aspirin 81 MG tablet Take 81 mg by mouth at bedtime.   atorvastatin (LIPITOR) 80 MG tablet Take 80 mg by mouth every evening.   finasteride (PROSCAR) 5 MG tablet Take 5 mg by mouth 2 (two) times a day.    fluticasone (FLONASE) 50 MCG/ACT nasal spray Place 2 sprays  into both nostrils daily as needed for allergies.    levofloxacin (LEVAQUIN) 500 MG tablet TK 1 T PO D FOR 7 DAYS   lisinopril (PRINIVIL,ZESTRIL) 10 MG tablet Take 10 mg by mouth daily.    naproxen sodium (ANAPROX) 220 MG tablet Take 220 mg by mouth 2 (two) times daily with a meal.   niacin (NIASPAN) 1000 MG CR tablet Take 1,000 mg by mouth at bedtime.   nitroGLYCERIN (NITROSTAT) 0.4 MG SL tablet Place 0.4 mg under the tongue every 5 (five) minutes as needed for chest pain.    Omega-3 Fatty Acids (FISH OIL) 1000 MG CAPS Take 1,000 mg by mouth daily.    omeprazole (PRILOSEC OTC) 20 MG tablet Take 20 mg by mouth daily.   WIXELA INHUB 100-50 MCG/DOSE AEPB INL 1 PUFF PO TWICE DAILY. RM WITH WATER AFTER U     Allergies:   Patient has no known allergies.   Social History   Tobacco Use   Smoking status: Former Smoker    Packs/day: 0.50    Years: 35.00    Pack years: 17.50    Last attempt to quit: 01/07/2002    Years since quitting: 16.4   Smokeless tobacco: Former Systems developer  Substance Use Topics   Alcohol use: Yes    Comment: occasionally   Drug use: No     Family Hx: The patient's He was adopted. Family history is unknown by patient.  ROS:   Please see the history of present illness.    As stated in the HPI and negative for all other systems.   Prior CV studies:   The following studies were reviewed today:  Labs and [facility] office records from 06/18/18  Labs/Other Tests and Data Reviewed:    EKG:  No ECG reviewed.  Recent Labs: No results found for requested labs within last 8760 hours.   Recent Lipid Panel Lab Results  Component Value Date/Time   CHOL 144 06/13/2013 04:34 AM   TRIG 116 06/13/2013 04:34 AM   HDL 48 06/13/2013 04:34 AM   CHOLHDL 3.0 06/13/2013 04:34 AM   LDLCALC 73 06/13/2013 04:34 AM    Wt Readings from Last 3 Encounters:  06/22/18 229 lb (103.9 kg)  08/26/16 230 lb (104.3 kg)  08/16/16 230 lb (104.3 kg)     Objective:    Vital Signs:   BP (!) 144/83    Pulse 75    Ht 5\' 10"  (1.778 m)    Wt 229 lb (103.9 kg)    BMI 32.86 kg/m    VITAL SIGNS:  reviewed GEN:  no acute distress EYES:  sclerae anicteric, EOMI - Extraocular Movements Intact NEURO:  alert and oriented x 3, no obvious focal deficit PSYCH:  normal  affect  ASSESSMENT & PLAN:    SOB:    His shortness of breath is somewhat atypical and exactly the same as he described previously.  At that time he had negative ischemia work-ups.  I do not strongly suspect structural coronary disease as the etiology.  I would like to have him get an EKG.  Also check a BNP level.  Finally handling to switch him from lisinopril to Cozaar in case this might be exacerbating some underlying chronic bronchospasm which is mentioned previously.  If he is not better he knows to see me soon and I will schedule a 1 month in office visit which he can cancel if he is completely improved with antibiotic on time and a switch of his medications.   HTN:     This will be managed with  COVID-19 Education: The signs and symptoms of COVID-19 were discussed with the patient and how to seek care for testing (follow up with PCP or arrange E-visit).  The importance of social distancing was discussed today.  Time:   Today, I have spent 25 minutes with the patient with telehealth technology discussing the above problems.     Medication Adjustments/Labs and Tests Ordered: Current medicines are reviewed at length with the patient today.  Concerns regarding medicines are outlined above.   Tests Ordered: No orders of the defined types were placed in this encounter.   Medication Changes: No orders of the defined types were placed in this encounter.   Disposition:  Follow up with me in the office in one month  Signed, Minus Breeding, MD  06/22/2018 11:31 AM    [place]

## 2018-06-22 ENCOUNTER — Ambulatory Visit (INDEPENDENT_AMBULATORY_CARE_PROVIDER_SITE_OTHER): Payer: Medicare Other | Admitting: *Deleted

## 2018-06-22 ENCOUNTER — Other Ambulatory Visit: Payer: Self-pay

## 2018-06-22 ENCOUNTER — Encounter: Payer: Self-pay | Admitting: Cardiology

## 2018-06-22 ENCOUNTER — Telehealth (INDEPENDENT_AMBULATORY_CARE_PROVIDER_SITE_OTHER): Payer: Medicare Other | Admitting: Cardiology

## 2018-06-22 VITALS — BP 144/83 | HR 75 | Ht 70.0 in | Wt 229.0 lb

## 2018-06-22 DIAGNOSIS — R0602 Shortness of breath: Secondary | ICD-10-CM

## 2018-06-22 DIAGNOSIS — I10 Essential (primary) hypertension: Secondary | ICD-10-CM

## 2018-06-22 DIAGNOSIS — Z7189 Other specified counseling: Secondary | ICD-10-CM

## 2018-06-22 MED ORDER — LOSARTAN POTASSIUM 25 MG PO TABS: 25.0000 mg | ORAL_TABLET | Freq: Every day | ORAL | 3 refills | Status: DC

## 2018-06-22 NOTE — Patient Instructions (Signed)
Medication Instructions:  STOP- Lisinopril  START- Losartan 25 mg daily  If you need a refill on your cardiac medications before your next appointment, please call your pharmacy.  Labwork: BNP today  Testing/Procedures: None Ordered  Special Instructions: EKG Check  Follow-Up: . Your physician recommends that you schedule a follow-up appointment in: 1 Month June 11th @ 12:00 pm   At [facility], you and your health needs are our priority.  As part of our continuing mission to provide you with exceptional heart care, we have created designated Provider Care Teams.  These Care Teams include your primary Cardiologist (physician) and Advanced Practice Providers (APPs -  Physician Assistants and Nurse Practitioners) who all work together to provide you with the care you need, when you need it.  Thank you for choosing [facility] at [facility]!!

## 2018-06-22 NOTE — Progress Notes (Signed)
Pt presented to the office today at the request of Dr. Warren Lacy due to SOB. EKG reviewed by Dr. Stanford Breed (DOD) who report strip shows NSR HR 65. Will have strip scanned into chart for requesting MD to review.

## 2018-06-23 LAB — BRAIN NATRIURETIC PEPTIDE: BNP: 7.6 pg/mL (ref 0.0–100.0)

## 2018-07-04 ENCOUNTER — Telehealth: Payer: Self-pay | Admitting: Cardiology

## 2018-07-04 NOTE — Telephone Encounter (Signed)
My Chart message sent

## 2018-07-19 ENCOUNTER — Ambulatory Visit: Payer: Medicare Other | Admitting: Cardiology

## 2018-08-06 ENCOUNTER — Ambulatory Visit: Payer: Medicare Other | Admitting: Cardiology

## 2018-09-05 ENCOUNTER — Other Ambulatory Visit: Payer: Self-pay | Admitting: Internal Medicine

## 2018-09-05 ENCOUNTER — Other Ambulatory Visit: Payer: Self-pay

## 2018-09-05 ENCOUNTER — Ambulatory Visit
Admission: RE | Admit: 2018-09-05 | Discharge: 2018-09-05 | Disposition: A | Discharge status: Home / Self Care | Payer: Medicare Other | Source: Ambulatory Visit | Attending: Internal Medicine | Admitting: Internal Medicine

## 2018-09-05 DIAGNOSIS — J690 Pneumonitis due to inhalation of food and vomit: Secondary | ICD-10-CM

## 2018-09-11 ENCOUNTER — Other Ambulatory Visit (HOSPITAL_COMMUNITY): Payer: Self-pay | Admitting: Internal Medicine

## 2018-09-11 DIAGNOSIS — R918 Other nonspecific abnormal finding of lung field: Secondary | ICD-10-CM

## 2018-09-18 ENCOUNTER — Other Ambulatory Visit: Payer: Self-pay

## 2018-09-18 ENCOUNTER — Ambulatory Visit (HOSPITAL_COMMUNITY)
Admission: RE | Admit: 2018-09-18 | Discharge: 2018-09-18 | Disposition: A | Discharge status: Home / Self Care | Payer: Medicare Other | Source: Ambulatory Visit | Attending: Internal Medicine | Admitting: Internal Medicine

## 2018-09-18 DIAGNOSIS — I700 Atherosclerosis of aorta: Secondary | ICD-10-CM | POA: Diagnosis not present

## 2018-09-18 DIAGNOSIS — K449 Diaphragmatic hernia without obstruction or gangrene: Secondary | ICD-10-CM | POA: Diagnosis not present

## 2018-09-18 DIAGNOSIS — R918 Other nonspecific abnormal finding of lung field: Secondary | ICD-10-CM | POA: Diagnosis present

## 2018-09-18 DIAGNOSIS — J439 Emphysema, unspecified: Secondary | ICD-10-CM | POA: Diagnosis not present

## 2018-09-18 DIAGNOSIS — I2510 Atherosclerotic heart disease of native coronary artery without angina pectoris: Secondary | ICD-10-CM | POA: Insufficient documentation

## 2018-09-18 LAB — GLUCOSE, CAPILLARY: Glucose-Capillary: 155 mg/dL — ABNORMAL HIGH (ref 70–99)

## 2018-09-18 MED ORDER — FLUDEOXYGLUCOSE F - 18 (FDG) INJECTION
11.3000 | Freq: Once | INTRAVENOUS | Status: AC | PRN
  Administered 2018-09-18: 11.3 via INTRAVENOUS

## 2018-09-20 ENCOUNTER — Other Ambulatory Visit: Payer: Self-pay | Admitting: Internal Medicine

## 2018-09-20 DIAGNOSIS — R918 Other nonspecific abnormal finding of lung field: Secondary | ICD-10-CM

## 2018-09-21 ENCOUNTER — Ambulatory Visit: Payer: Medicare Other | Admitting: Internal Medicine

## 2018-09-21 ENCOUNTER — Encounter: Payer: Medicare Other | Admitting: Thoracic Surgery (Cardiothoracic Vascular Surgery)

## 2018-09-21 ENCOUNTER — Other Ambulatory Visit: Payer: Self-pay

## 2018-09-21 ENCOUNTER — Encounter: Payer: Self-pay | Admitting: Internal Medicine

## 2018-09-21 DIAGNOSIS — R911 Solitary pulmonary nodule: Secondary | ICD-10-CM | POA: Diagnosis not present

## 2018-09-21 DIAGNOSIS — I10 Essential (primary) hypertension: Secondary | ICD-10-CM

## 2018-09-21 DIAGNOSIS — J449 Chronic obstructive pulmonary disease, unspecified: Secondary | ICD-10-CM

## 2018-09-21 NOTE — Patient Instructions (Addendum)
Ok to stop anoro after Monday 09/24/2018 and if see if there is any your desired activity tolerance  (think of it like high octane fuel)    If you feel you can't do without it by trial and error I'd like to have you return for PFT's   Otherwise follow up here is as needed

## 2018-09-21 NOTE — Progress Notes (Addendum)
Todd Cummings, male    DOB: Mar 09, 1940,   MRN: [id number]   Brief patient profile:  63 yowm quit smoking 2003 with no resp symptoms at wt low 200s with doe that was assoc with doe then much worse since  March 2020 so referred to pulmonary clinic 09/21/2018 by Dr   Joylene Draft but in meantime improved "after I changed bp meds" (  according to his written log this was actually done Jun 22 2018 by Dr Percival Spanish )      History of Present Illness  09/21/2018  [name]  Chief Complaint  Patient presents with  . Pulmonary Consult    Referred by Dr. Joylene Draft. [name]. He has had PNA recently. He rarely uses his albuterol inhaler.   Dyspnea: now walking  2 miles daily around [place] in less than hour s sob  Cough: no  Sleep: able to lie flat now  Previously ahd  sit up p 30 min supine  SABA use: none now / saba didn't help previously  On anoro vs wixella both listed, he's not sure  Also note his meds as listed for PCP are not the same as [facility] list and included lisinopril as recently as Sep 11 2018    No obvious day to day or daytime variability or assoc excess/ purulent sputum or mucus plugs or hemoptysis or cp or chest tightness, subjective wheeze or overt sinus or hb symptoms.   Sleeping  without nocturnal  or early am exacerbation  of respiratory  c/o's or need for noct saba. Also denies any obvious fluctuation of symptoms with weather or environmental changes or other aggravating or alleviating factors except as outlined above   No unusual exposure hx or h/o childhood pna/ asthma or knowledge of premature birth.  Current Allergies, Complete Past Medical History, Past Surgical History, Family History, and Social History were reviewed in [employer] record.  ROS  The following are not active complaints unless bolded Hoarseness, sore throat, dysphagia, dental problems, itching, sneezing,  nasal congestion or discharge  of excess mucus or purulent secretions, ear ache,   fever, chills, sweats, unintended wt loss or wt gain, classically pleuritic or exertional cp,  orthopnea pnd or arm/hand swelling  or leg swelling, presyncope, palpitations, abdominal pain, anorexia, nausea, vomiting, diarrhea  or change in bowel habits or change in bladder habits, change in stools or change in urine, dysuria, hematuria,  rash, arthralgias, visual complaints, headache, numbness, weakness or ataxia or problems with walking or coordination,  change in mood or  memory.              Past Medical History:  Diagnosis Date  . Arthritis   . BPH (benign prostatic hypertrophy)   . Bronchospasm   . Esophageal stricture   . GERD (gastroesophageal reflux disease)   . HLD (hyperlipidemia)   . HTN (hypertension)   . Kidney stones     Outpatient Medications Prior to Visit - - NOTE:   Unable to verify as accurately reflecting what pt takes     Medication Sig Dispense Refill  . acetaminophen (TYLENOL) 650 MG CR tablet Take 650 mg by mouth daily as needed for pain.    Marland Kitchen albuterol (PROAIR HFA) 108 (90 Base) MCG/ACT inhaler Inhale 2 puffs into the lungs every 6 (six) hours as needed for wheezing or shortness of breath.    Marland Kitchen aspirin 81 MG tablet Take 81 mg by mouth at bedtime.    Marland Kitchen  atorvastatin (LIPITOR) 80 MG tablet Take 80 mg by mouth every evening.    . finasteride (PROSCAR) 5 MG tablet Take 5 mg by mouth 2 (two) times a day.     . fluticasone (FLONASE) 50 MCG/ACT nasal spray Place 2 sprays into both nostrils daily as needed for allergies.     Marland Kitchen levofloxacin (LEVAQUIN) 500 MG tablet TK 1 T PO D FOR 7 DAYS    . loratadine (CLARITIN) 10 MG tablet Take 10 mg by mouth daily.    . naproxen sodium (ANAPROX) 220 MG tablet Take 220 mg by mouth 2 (two) times daily with a meal.    . niacin (NIASPAN) 1000 MG CR tablet Take 1,000 mg by mouth at bedtime.    . nitroGLYCERIN (NITROSTAT) 0.4 MG SL tablet Place 0.4 mg under the tongue every 5 (five)  minutes as needed for chest pain.     . Omega-3 Fatty Acids (FISH OIL) 1000 MG CAPS Take 1,000 mg by mouth daily.     Marland Kitchen omeprazole (PRILOSEC OTC) 20 MG tablet Take 20 mg by mouth daily.    Marland Kitchen umeclidinium-vilanterol (ANORO ELLIPTA) 62.5-25 MCG/INH AEPB Inhale 1 puff into the lungs daily.    Marland Kitchen losartan (COZAAR) 25 MG tablet Take 1 tablet (25 mg total) by mouth daily. 90 tablet 3  .           Objective:     BP 124/62 (BP Location: Left Arm, Cuff Size: Normal)   Pulse 90   Temp 98.1 F (36.7 C) (Oral)   Ht 5\' 10"  (1.778 m)   Wt 236 lb (107 kg)   SpO2 94% Comment: on RA  BMI 33.86 kg/m   SpO2: 94 %(on RA)  RA  amb stoic wm nad  "I'm fine"   . HEENT: nl dentition / oropharynx. Nl external ear canals without cough reflex -  Mild bilateral non-specific turbinate edema     NECK :  without JVD/Nodes/TM/ nl carotid upstrokes bilaterally   LUNGS: no acc muscle use,  Min barrel  contour chest wall with bilateral  slightly decreased bs s audible wheeze and  without cough on insp or exp maneuver and min  Hyperresonant  to  percussion bilaterally     CV:  RRR  no s3 or murmur or increase in P2, and no edema   ABD:  soft and nontender with pos end  insp Hoover's  in the supine position. No bruits or organomegaly appreciated, bowel sounds nl  MS:   Nl gait/  ext warm without deformities, calf tenderness, cyanosis or clubbing No obvious joint restrictions   SKIN: warm and dry without lesions    NEURO:  alert, approp, nl sensorium with  no motor or cerebellar deficits apparent.         I personally reviewed images and agree with radiology impression as follows:   Chest PET CT  09/18/2018 1. No significant metabolism associated with the posterior right upper lobe subsolid pulmonary nodule, which has decreased in size to 1.1 cm from 1.7 cm on the recent 09/05/2018 chest CT study, most compatible with a resolving inflammatory nodule. Suggest attention on follow-up chest CT in 3-6  months to confirm continued clearance. 2. No suspicious hypermetabolic findings. 3. Chronic findings include: Aortic Atherosclerosis (ICD10-I70.0) and Emphysema (ICD10-J43.9). Coronary atherosclerosis. Small hiatal hernia.      Assessment   COPD GOLD 0/ ct dx only  Quit smoking 2003  - emphysema present on CT 09/05/18 moderate with non-specific int changes -  improved off acei as of 09/21/2018 to point of no sob at all  - rec trial off anoro starting 09/24/2018 as evidence of significant copd clinically is minimal though he is at risk (therefore GOLD 0)     When respiratory symptoms begin or become refractory well after a patient reports complete smoking cessation,  Especially when this wasn't the case while they were smoking, a red flag is raised based on the work of Dr Kris Mouton which states: if you quit smoking when your best day FEV1 is still well preserved it is highly unlikely you will progress to severe disease.  That is to say, once the smoking stops,  the symptoms should not suddenly erupt or markedly worsen.  If so, the differential diagnosis should include  Obesity(wt low 200s when quit and 236 now) deconditioning,  LPR/Reflux/Aspiration syndromes,  occult CHF, or  especially side effect of medications commonly used in this population, especially ACEi in this case, as the pt is convinced of though a bit unusual as denies assoc cough.    Pt not convinced the "inhalers" (I think he means anoro)  are making any difference in  symptoms so it's fine with me to try reducing them and observing for worse symptoms but needs to be more insightful about those specific symptoms related to copd and have an understanding/ access to an action plan for prns in event those symptoms worsen as taper meds.   This is the reverse of a therapeutic trial=  "stopping meds to see if it hurts"' rather than "starting meds to see if helps" but may in the end result in a much shorter list of meds and paradoxically  better (and at least more cost effective) care.    >>> try of anoro 09/24/18 then add back if starts having worse doe and if so return for pfts to make sure we have the right dx.     Pulmonary nodule seen on imaging study PET  09/18/18 neg and nodule improved > f/u rec in 3-6 m defer to PCP   He does not recall symptoms specifically related to these findings only that he was told "you have pna" which very well may have been the case and symptoms were accentuated with ACEi on board but it's hard to tell even in retrospect.     Essential hypertension Off ACEi ? Since May 2020   Although even in retrospect it may not be clear the ACEi contributed to the pt's symptoms,  Pt improved off them and adding them back at this point or in the future would risk confusion in interpretation of non-specific respiratory symptoms to which this patient is prone  ie  Better not to muddy the waters here.   NB  the best review of chronic cough to date ( NEJM 2016 375 [phone]) ,  ACEi are now felt to cause cough in up to  20% of pts which is a 4 fold increase from previous reports and does not include the variety of non-specific complaints we see in pulmonary clinic in pts on ACEi but previously attributed to another dx like  Copd/asthma and  include PNDS, throat and chest congestion, "bronchitis", unexplained dyspnea and noct smothering or choking sensations as was the case here , and hoarseness, but also  atypical /refractory GERD symptoms like dysphagia and "bad heartburn"    >>> pulmonary f/u is prn      Total time devoted to counseling  > 50 % of initial 60 min office visit:  Reviewed extensive case hx  with pt including office notes not in Epic and  discussion of options/alternatives/ personally creating written customized instructions  in presence of pt  then going over those specific  Instructions directly with the pt including how to use all of the meds but in particular covering each new medication in  detail and the difference between the maintenance= "automatic" meds and the prns using an action plan format for the latter (If this problem/symptom => do that organization reading Left to right).  Please see AVS from this visit for a full list of these instructions which I personally wrote for this pt and  are unique to this visit.      Christinia Gully, MD 09/21/2018

## 2018-09-22 ENCOUNTER — Encounter: Payer: Self-pay | Admitting: Internal Medicine

## 2018-09-22 DIAGNOSIS — J449 Chronic obstructive pulmonary disease, unspecified: Secondary | ICD-10-CM | POA: Insufficient documentation

## 2018-09-22 NOTE — Assessment & Plan Note (Signed)
PET  09/18/18 neg and nodule improved > f/u rec in 3-6 m defer to PCP   He does not recall symptoms specifically related to these findings only that he was told "you have pna" which very well may have been the case and symptoms were accentuated with ACEi on board but it's hard to tell even in retrospect.

## 2018-09-22 NOTE — Assessment & Plan Note (Signed)
Off ACEi ? Since May 2020   Although even in retrospect it may not be clear the ACEi contributed to the pt's symptoms,  Pt improved off them and adding them back at this point or in the future would risk confusion in interpretation of non-specific respiratory symptoms to which this patient is prone  ie  Better not to muddy the waters here.   NB  the best review of chronic cough to date ( NEJM 2016 375 [phone]) ,  ACEi are now felt to cause cough in up to  20% of pts which is a 4 fold increase from previous reports and does not include the variety of non-specific complaints we see in pulmonary clinic in pts on ACEi but previously attributed to another dx like  Copd/asthma and  include PNDS, throat and chest congestion, "bronchitis", unexplained dyspnea and noct smothering or choking sensations as was the case here , and hoarseness, but also  atypical /refractory GERD symptoms like dysphagia and "bad heartburn"    >>> pulmonary f/u is prn      Total time devoted to counseling  > 50 % of initial 60 min office visit:  Reviewed extensive case hx  with pt including office notes not in Epic and  discussion of options/alternatives/ personally creating written customized instructions  in presence of pt  then going over those specific  Instructions directly with the pt including how to use all of the meds but in particular covering each new medication in detail and the difference between the maintenance= "automatic" meds and the prns using an action plan format for the latter (If this problem/symptom => do that organization reading Left to right).  Please see AVS from this visit for a full list of these instructions which I personally wrote for this pt and  are unique to this visit.

## 2018-09-22 NOTE — Assessment & Plan Note (Addendum)
Quit smoking 2003  - emphysema present on CT 09/05/18 moderate with non-specific int changes - improved off acei as of 09/21/2018 to point of no sob at all  - rec trial off anoro starting 09/24/2018 as evidence of significant copd clinically is minimal though he is at risk (therefore GOLD 0)     When respiratory symptoms begin or become refractory well after a patient reports complete smoking cessation,  Especially when this wasn't the case while they were smoking, a red flag is raised based on the work of Dr Kris Mouton which states: if you quit smoking when your best day FEV1 is still well preserved it is highly unlikely you will progress to severe disease.  That is to say, once the smoking stops,  the symptoms should not suddenly erupt or markedly worsen.  If so, the differential diagnosis should include  Obesity(wt low 200s when quit and 236 now)  /deconditioning,  LPR/Reflux/Aspiration syndromes,  occult CHF, or  especially side effect of medications commonly used in this population, especially ACEi in this case, as the pt is convinced of though a bit unusual as denies assoc cough.    Pt not convinced the "inhalers" (I think he means anoro)  are making any difference in  symptoms so it's fine with me to try reducing them and observing for worse symptoms but needs to be more insightful about those specific symptoms related to copd and have an understanding/ access to an action plan for prns in event those symptoms worsen as taper meds.   This is the reverse of a therapeutic trial=  "stopping meds to see if it hurts"' rather than "starting meds to see if helps" but may in the end result in a much shorter list of meds and paradoxically better (and at least more cost effective) care.    >>> try of anoro 09/24/18 then add back if starts having worse doe and if so return for pfts to make sure we have the right dx.

## 2018-10-08 DIAGNOSIS — R0602 Shortness of breath: Secondary | ICD-10-CM

## 2018-10-08 NOTE — Telephone Encounter (Signed)
Pt sent the following MyChart message at 10/08/2018 9:14 AM EDT:  "I stopped the Anoro for 4 days and had difficulty sleeping. I wake up after 4 hours of sleep and have difficulty going back to sleep. I feel short of breath and can not lay down. I  ended up taking the rescue inhaler and when that does not help I took Anoro about 1 hour later.  This has happened twice over the period of time that I have tried to stop Anoro.  My wife has Asthma and has a peak flow meter. I cleaned it with alcohol and used it to see my numbers when I feel more short of breath. Initially I could do 450 and today only 200.  Dr. Melvyn Novas suggested that I come back if needed to have Pulmonary Function tests.  I would like to proceed with this."  According to last office notes 09/21/2018, MW suggested pt to return with PFTs if Anoro did not help him. Before we proceed with scheduling, I would like to verify a time frame MW would suggest to have this done, as there are COVID protocols in place for PFTs.   MW, please advise with your recommendations. Thank you.

## 2018-10-08 NOTE — Telephone Encounter (Signed)
Noted. I have sent a response MyChart message regarding Todd Cummings's recommendations. Order for full PFT has been placed. Routing this message to Todd Cummings to get the pt scheduled at the next available PFT slot. Pt will need a follow up visit with Dr. Melvyn Novas on the same day the PFT is scheduled. Thank you.

## 2018-10-08 NOTE — Telephone Encounter (Signed)
He should certainly continue the anoro each am  Except  for the day of the PFT and don't take it then - schedule next available slot

## 2018-10-30 ENCOUNTER — Other Ambulatory Visit: Payer: Self-pay | Admitting: Internal Medicine

## 2018-11-06 ENCOUNTER — Other Ambulatory Visit (HOSPITAL_COMMUNITY)
Admission: RE | Admit: 2018-11-06 | Discharge: 2018-11-06 | Disposition: A | Discharge status: Home / Self Care | Payer: Medicare Other | Source: Ambulatory Visit | Attending: Internal Medicine | Admitting: Internal Medicine

## 2018-11-06 DIAGNOSIS — Z20828 Contact with and (suspected) exposure to other viral communicable diseases: Secondary | ICD-10-CM | POA: Diagnosis not present

## 2018-11-06 DIAGNOSIS — Z01812 Encounter for preprocedural laboratory examination: Secondary | ICD-10-CM | POA: Insufficient documentation

## 2018-11-07 LAB — NOVEL CORONAVIRUS, NAA (HOSP ORDER, SEND-OUT TO REF LAB; TAT 18-24 HRS): SARS-CoV-2, NAA: NOT DETECTED

## 2018-11-08 NOTE — Progress Notes (Signed)
Pt notified neg covid

## 2018-11-09 ENCOUNTER — Ambulatory Visit: Payer: Medicare Other | Admitting: Internal Medicine

## 2018-11-09 ENCOUNTER — Ambulatory Visit (INDEPENDENT_AMBULATORY_CARE_PROVIDER_SITE_OTHER): Payer: Medicare Other | Admitting: Internal Medicine

## 2018-11-09 ENCOUNTER — Other Ambulatory Visit: Payer: Self-pay

## 2018-11-09 DIAGNOSIS — R0602 Shortness of breath: Secondary | ICD-10-CM

## 2018-11-09 LAB — PULMONARY FUNCTION TEST
DL/VA % pred: 71 %
DL/VA: 2.85 ml/min/mmHg/L
DLCO unc % pred: 74 %
DLCO unc: 18.14 ml/min/mmHg
FEF 25-75 Post: 2.54 L/sec
FEF 25-75 Pre: 2.26 L/sec
FEF2575-%Change-Post: 12 %
FEF2575-%Pred-Post: 122 %
FEF2575-%Pred-Pre: 108 %
FEV1-%Change-Post: 2 %
FEV1-%Pred-Post: 111 %
FEV1-%Pred-Pre: 108 %
FEV1-Post: 3.23 L
FEV1-Pre: 3.15 L
FEV1FVC-%Change-Post: 3 %
FEV1FVC-%Pred-Pre: 101 %
FEV6-%Change-Post: 0 %
FEV6-%Pred-Post: 111 %
FEV6-%Pred-Pre: 110 %
FEV6-Post: 4.21 L
FEV6-Pre: 4.18 L
FEV6FVC-%Change-Post: 1 %
FEV6FVC-%Pred-Post: 106 %
FEV6FVC-%Pred-Pre: 104 %
FVC-%Change-Post: 0 %
FVC-%Pred-Post: 105 %
FVC-%Pred-Pre: 105 %
FVC-Post: 4.25 L
FVC-Pre: 4.27 L
Post FEV1/FVC ratio: 76 %
Post FEV6/FVC ratio: 99 %
Pre FEV1/FVC ratio: 74 %
Pre FEV6/FVC Ratio: 98 %
RV % pred: 106 %
RV: 2.69 L
TLC % pred: 100 %
TLC: 6.85 L

## 2018-11-09 NOTE — Progress Notes (Signed)
Pt is scheduled for televisit on Monday at 23 am with MW. Left reminder message about appt and PFT will be discussed then. Nothing further needed.

## 2018-11-09 NOTE — Progress Notes (Signed)
PFT done today. 

## 2018-11-12 ENCOUNTER — Ambulatory Visit (INDEPENDENT_AMBULATORY_CARE_PROVIDER_SITE_OTHER): Payer: Medicare Other | Admitting: Internal Medicine

## 2018-11-12 ENCOUNTER — Other Ambulatory Visit: Payer: Self-pay

## 2018-11-12 ENCOUNTER — Encounter: Payer: Self-pay | Admitting: Internal Medicine

## 2018-11-12 DIAGNOSIS — I10 Essential (primary) hypertension: Secondary | ICD-10-CM | POA: Diagnosis not present

## 2018-11-12 DIAGNOSIS — J339 Nasal polyp, unspecified: Secondary | ICD-10-CM

## 2018-11-12 DIAGNOSIS — J449 Chronic obstructive pulmonary disease, unspecified: Secondary | ICD-10-CM

## 2018-11-12 NOTE — Progress Notes (Signed)
Todd Cummings, male    DOB: [birth year],   MRN: [id number]   Brief patient profile:  38 yowm quit smoking 2003 with no resp symptoms at wt low 200s with doe that was assoc with doe then much worse since  March 2020 so referred to pulmonary clinic 09/21/2018 by Dr   Joylene Draft but in meantime improved "after I changed bp meds" (  according to his written log this was actually done Jun 22 2018 by Dr Percival Spanish )      History of Present Illness  09/21/2018  Pulmonary/ 1st office eval/[facility]  Chief Complaint  Patient presents with  . Pulmonary Consult    Referred by Dr. Joylene Draft. [name]. He has had PNA recently. He rarely uses his albuterol inhaler.   Dyspnea: now walking  2 miles daily around [place] in less than hour s sob  Cough: no  Sleep: able to lie flat now  Previously ahd  sit up p 30 min supine  SABA use: none now / saba didn't help previously  On anoro vs wixella both listed, he's not sure  Also note his meds as listed for PCP are not the same as [facility] list and included lisinopril as recently as Sep 11 2018. rec Ok to stop anoro after Monday 09/24/2018 and if see if there is any effect on your desired activity tolerance  (think of it like high octane fuel)  If you feel you can't do without it by trial and error I'd like to have you return for PFT's    Virtual Visit via Telephone Note 11/12/2018   I connected with [name] on 11/12/18 at 10:00 AM EDT by telephone and verified that I am speaking with the correct person using two identifiers.   I discussed the limitations, risks, security and privacy concerns of performing an evaluation and management service by telephone and the availability of in person appointments. I also discussed with the patient that there may be a patient responsible charge related to this service. The patient expressed understanding and agreed to proceed.   History of Present Illness:  COPD GOLD O (CT criteria for emphysema)   Dyspnea:  Still taking anoro walking 30 min mod pace [place] still sob with hills  Cough: no  Sleeping: prone/ swimmer's position / freq awakening, pt convinced it's the nasal obst and improves with breathe-rite. 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    Also denies any obvious fluctuation of symptoms with weather or environmental changes or other aggravating or alleviating factors except as outlined above.   Meds reviewed/ med reconciliation completed   Past Medical History:  Diagnosis Date  . Arthritis   . BPH (benign prostatic hypertrophy)   . Bronchospasm   . Esophageal stricture   . GERD (gastroesophageal reflux disease)   . HLD (hyperlipidemia)   . HTN (hypertension)   . Kidney stones               Observations/Objective: Sounds great on the phone    Assessment and Plan: See problem list for active a/p's   Follow Up Instructions: See avs for instructions unique to this ov which includes revised/ updated med  list     I discussed the assessment and treatment plan with the patient. The patient was provided an opportunity to ask questions and all were answered. The patient agreed with the plan and demonstrated an understanding of the instructions.   The patient was advised to call back or seek an in-person evaluation if the symptoms worsen or if the condition fails to improve as anticipated.  I provided 30  minutes of non-face-to-face time during this encounter.   Christinia Gully, MD

## 2018-11-12 NOTE — Patient Instructions (Addendum)
To get the most out of exercise, you need to be continuously aware that you are short of breath, but never out of breath, for 30 minutes daily. As you improve, it will actually be easier for you to do the same amount of exercise  in  30 minutes so always push to the level where you are short of breath.  Try off anoro to see if your exercise tolerance or need for Anoro change and if not then leave off the anoro indefinitely - if not satisfied with ex tolerance then return to office for a "test drive" around the office with pulse oximetry (which you can monitor at home as well ).  See Dr Wilburn Cornelia re your nasal congestion during sleep hours - if sleep still an issue then you may need to see a sleep specialist per Dr Joylene Draft (or we can refer you to one of ours)   Pulmonary follow up is as needed

## 2018-11-12 NOTE — Assessment & Plan Note (Addendum)
Off ACEi  Since May 2020   Clearly better off acei   >  F/u per PCP/ cardiology

## 2018-11-12 NOTE — Assessment & Plan Note (Signed)
Quit smoking 2003  - emphysema present on CT 09/05/18 moderate with non-specific int changes - improved off acei as of 09/21/2018 to point of no sob at all  - rec trial off anoro starting 09/24/2018 > did not do  - PFT's  11/09/2018   FEV1 3.23 (111 % ) ratio 0.76  p 2 % improvement from saba p 0 prior to study with DLCO  18.14 (74%) corrects to 2.85 (71%)  for alv volume and FV curve min concavity on expiration , somewhat truncated insp loop   - 11/12/2018 again rec try off anoro 11/12/2018 and return for walking sats prn    There is no evidence to support significant copd here as far as being a limiting factor with ex tol or risk for aecopd so should be ok off anoro on trial basis.   If having flares of sob could still have AB for which symbicort 80 could be considered rather than escalate rx for copd, which he does not have.  If having reproducible doe then walking sats in office and consider cpst next.   Pulmonary f/u is prn

## 2018-11-12 NOTE — Assessment & Plan Note (Signed)
S/p Shoemaker surgery ? Date (not in epic)   Noct nasal symptoms improve with breathe-rite strips so likely due to recurrent polyposis or chronic rhinitis not responding to flonase > referred back to Dr Wilburn Cornelia.   Each maintenance medication was reviewed in detail including most importantly the difference between maintenance and as needed and under what circumstances the prns are to be used.  Please see AVS for specific  Instructions which are unique to this visit and I personally typed out  which were reviewed in detail over the phone with the patient and wife [name]  and a copy provided.

## 2019-02-21 ENCOUNTER — Other Ambulatory Visit: Payer: Self-pay

## 2019-02-21 ENCOUNTER — Ambulatory Visit
Admission: RE | Admit: 2019-02-21 | Discharge: 2019-02-21 | Disposition: A | Discharge status: Home / Self Care | Payer: Medicare Other | Source: Ambulatory Visit | Attending: Internal Medicine | Admitting: Internal Medicine

## 2019-02-21 DIAGNOSIS — R918 Other nonspecific abnormal finding of lung field: Secondary | ICD-10-CM

## 2019-03-06 ENCOUNTER — Emergency Department (HOSPITAL_COMMUNITY): Payer: Medicare Other

## 2019-03-06 ENCOUNTER — Emergency Department (HOSPITAL_COMMUNITY)
Admission: EM | Admit: 2019-03-06 | Discharge: 2019-03-06 | Disposition: A | Discharge status: Home / Self Care | Payer: Medicare Other | Attending: Emergency Medicine | Admitting: Emergency Medicine

## 2019-03-06 ENCOUNTER — Other Ambulatory Visit: Payer: Self-pay

## 2019-03-06 ENCOUNTER — Encounter (HOSPITAL_COMMUNITY): Payer: Self-pay

## 2019-03-06 DIAGNOSIS — I10 Essential (primary) hypertension: Secondary | ICD-10-CM | POA: Insufficient documentation

## 2019-03-06 DIAGNOSIS — Z87891 Personal history of nicotine dependence: Secondary | ICD-10-CM | POA: Insufficient documentation

## 2019-03-06 DIAGNOSIS — R39198 Other difficulties with micturition: Secondary | ICD-10-CM | POA: Insufficient documentation

## 2019-03-06 DIAGNOSIS — Z7982 Long term (current) use of aspirin: Secondary | ICD-10-CM | POA: Diagnosis not present

## 2019-03-06 DIAGNOSIS — Z79899 Other long term (current) drug therapy: Secondary | ICD-10-CM | POA: Diagnosis not present

## 2019-03-06 DIAGNOSIS — R1900 Intra-abdominal and pelvic swelling, mass and lump, unspecified site: Secondary | ICD-10-CM | POA: Diagnosis present

## 2019-03-06 DIAGNOSIS — J449 Chronic obstructive pulmonary disease, unspecified: Secondary | ICD-10-CM | POA: Diagnosis not present

## 2019-03-06 DIAGNOSIS — N401 Enlarged prostate with lower urinary tract symptoms: Secondary | ICD-10-CM | POA: Insufficient documentation

## 2019-03-06 LAB — CBC WITH DIFFERENTIAL/PLATELET
Abs Immature Granulocytes: 0.02 10*3/uL (ref 0.00–0.07)
Basophils Absolute: 0.1 10*3/uL (ref 0.0–0.1)
Basophils Relative: 1 %
Eosinophils Absolute: 0.3 10*3/uL (ref 0.0–0.5)
Eosinophils Relative: 3 %
HCT: 46.2 % (ref 39.0–52.0)
Hemoglobin: 15.4 g/dL (ref 13.0–17.0)
Immature Granulocytes: 0 %
Lymphocytes Relative: 48 %
Lymphs Abs: 5.4 10*3/uL — ABNORMAL HIGH (ref 0.7–4.0)
MCH: 31.2 pg (ref 26.0–34.0)
MCHC: 33.3 g/dL (ref 30.0–36.0)
MCV: 93.7 fL (ref 80.0–100.0)
Monocytes Absolute: 0.6 10*3/uL (ref 0.1–1.0)
Monocytes Relative: 6 %
Neutro Abs: 4.6 10*3/uL (ref 1.7–7.7)
Neutrophils Relative %: 42 %
Platelets: 201 10*3/uL (ref 150–400)
RBC: 4.93 MIL/uL (ref 4.22–5.81)
RDW: 14.1 % (ref 11.5–15.5)
WBC: 11.2 10*3/uL — ABNORMAL HIGH (ref 4.0–10.5)
nRBC: 0 % (ref 0.0–0.2)

## 2019-03-06 LAB — URINALYSIS, ROUTINE W REFLEX MICROSCOPIC
Bilirubin Urine: NEGATIVE
Glucose, UA: 150 mg/dL — AB
Hgb urine dipstick: NEGATIVE
Ketones, ur: NEGATIVE mg/dL
Leukocytes,Ua: NEGATIVE
Nitrite: NEGATIVE
Protein, ur: NEGATIVE mg/dL
Specific Gravity, Urine: 1.019 (ref 1.005–1.030)
pH: 5 (ref 5.0–8.0)

## 2019-03-06 LAB — COMPREHENSIVE METABOLIC PANEL
ALT: 37 U/L (ref 0–44)
AST: 33 U/L (ref 15–41)
Albumin: 4.6 g/dL (ref 3.5–5.0)
Alkaline Phosphatase: 92 U/L (ref 38–126)
Anion gap: 12 (ref 5–15)
BUN: 21 mg/dL (ref 8–23)
CO2: 25 mmol/L (ref 22–32)
Calcium: 9.3 mg/dL (ref 8.9–10.3)
Chloride: 102 mmol/L (ref 98–111)
Creatinine, Ser: 1.34 mg/dL — ABNORMAL HIGH (ref 0.61–1.24)
GFR calc Af Amer: 59 mL/min — ABNORMAL LOW (ref >60)
GFR calc non Af Amer: 51 mL/min — ABNORMAL LOW (ref >60)
Glucose, Bld: 182 mg/dL — ABNORMAL HIGH (ref 70–99)
Potassium: 4.2 mmol/L (ref 3.5–5.1)
Sodium: 139 mmol/L (ref 135–145)
Total Bilirubin: 0.9 mg/dL (ref 0.3–1.2)
Total Protein: 8 g/dL (ref 6.5–8.1)

## 2019-03-06 LAB — LIPASE, BLOOD: Lipase: 29 U/L (ref 11–51)

## 2019-03-06 MED ORDER — IOHEXOL 300 MG/ML  SOLN
100.0000 mL | Freq: Once | INTRAMUSCULAR | Status: AC | PRN
  Administered 2019-03-06: 100 mL via INTRAVENOUS

## 2019-03-06 MED ORDER — SODIUM CHLORIDE (PF) 0.9 % IJ SOLN
INTRAMUSCULAR | Status: AC
  Filled 2019-03-06: qty 50

## 2019-03-06 NOTE — Discharge Instructions (Addendum)
You were seen in the emergency department today for abdominal swelling and difficulty urinating.  Your work-up was overall reassuring.  Your CT scan did not show any substantial new abnormalities.  Your labs look good, your creatinine, a measure of kidney function, was mildly elevated, please have this rechecked by primary care.  Please follow-up with your primary care provider as well as your urologist within the next 3 days.  Return to the ER for new or worsening symptoms including but not limited to abdominal pain, inability to urinate, fever, inability to pass gas/have bowel movements, or any other concerns.

## 2019-03-06 NOTE — ED Triage Notes (Signed)
Patient states he has left abdominal distention x 3-4 days and states his urine flow has been decreased x 2 days.

## 2019-03-06 NOTE — ED Provider Notes (Signed)
[facility] DEPT Provider Note   CSN: [id number] Arrival date & time: 03/06/19  1817     History Chief Complaint  Patient presents with  . abdominal distention    Todd Cummings is a 79 y.o. male with a history of COPD, hypertension, hyperlipidemia, GERD, kidney stones, & prior TURP who presents to the emergency department with complaints of left-sided abdominal swelling over the past few days.  Patient states he has noted an area of progressively worsening swelling to the left side of the lower abdomen.  He states this area is not particularly painful.  Notes swelling seems more prominent when he is standing up as opposed to laying down, here are no other alleviating or aggravating factors to this.  He has had some difficulty urinating over the past few days, states he has difficulty initiating urination but then has a steady stream and feels that he is fully emptying his bladder.  Most recently urinated 30 minutes prior to arrival, does not feel the urge to urinate currently.  He states that he did have a cystoscopy for about 5 to 6 days of hematuria by his urologist Dr. Gilford Rile, this did not show any abnormalities, and hematuria has since resolved.  He denies fever, chills, nausea, vomiting, diarrhea, constipation, melena, hematochezia, dysuria, or recent abdominal injury.  HPI     Past Medical History:  Diagnosis Date  . Arthritis   . BPH (benign prostatic hypertrophy)   . Bronchospasm   . Esophageal stricture   . GERD (gastroesophageal reflux disease)   . HLD (hyperlipidemia)   . HTN (hypertension)   . Kidney stones     Patient Active Problem List   Diagnosis Date Noted  . Nasal polyps 11/12/2018  . COPD GOLD 0/ ct dx only  09/22/2018  . Class 1 obesity without serious comorbidity with body mass index (BMI) of 33.0 to 33.9 in adult 08/17/2016  . SOB (shortness of breath) 08/17/2016  . Encephalopathy, unspecified 10/04/2013  . Essential  hypertension 10/04/2013  . TIA (transient ischemic attack) 06/12/2013  . HTN (hypertension) 06/12/2013  . Pulmonary nodule seen on imaging study 06/12/2013    Past Surgical History:  Procedure Laterality Date  . CATARACT EXTRACTION    . COLONOSCOPY    . ESOPHAGEAL DILATION  10/02  . ESOPHAGOGASTRODUODENOSCOPY (EGD) WITH PROPOFOL N/A 01/16/2015   Procedure: ESOPHAGOGASTRODUODENOSCOPY (EGD) WITH PROPOFOL;  Surgeon: Laurence Spates, MD;  Location: [facility];  Service: Endoscopy;  Laterality: N/A;  . HEEL SPUR SURGERY  4/02  . SAVORY DILATION N/A 01/16/2015   Procedure: SAVORY DILATION;  Surgeon: Laurence Spates, MD;  Location: [facility];  Service: Endoscopy;  Laterality: N/A;  . TRANSURETHRAL RESECTION OF PROSTATE  6/02       Family History  Adopted: Yes  Family history unknown: Yes    Social History   Tobacco Use  . Smoking status: Former Smoker    Packs/day: 0.50    Years: 35.00    Pack years: 17.50    Quit date: 01/07/2002    Years since quitting: 17.1  . Smokeless tobacco: Former Network engineer Use Topics  . Alcohol use: Yes    Comment: occasionally  . Drug use: No    Home Medications Prior to Admission medications   Medication Sig Start Date End Date Taking? Authorizing Provider  acetaminophen (TYLENOL) 650 MG CR tablet Take 650 mg by mouth daily as needed for pain.    [provider]  albuterol (PROAIR HFA) 108 (90  Base) MCG/ACT inhaler Inhale 2 puffs into the lungs every 6 (six) hours as needed for wheezing or shortness of breath.    [provider]  aspirin 81 MG tablet Take 81 mg by mouth at bedtime.    [provider]  atorvastatin (LIPITOR) 80 MG tablet Take 80 mg by mouth every evening.    [provider]  finasteride (PROSCAR) 5 MG tablet Take 5 mg by mouth 2 (two) times a day.     [provider]  fluticasone (FLONASE) 50 MCG/ACT nasal spray Place 2 sprays into both nostrils daily as needed for allergies.      [provider]  loratadine (CLARITIN) 10 MG tablet Take 10 mg by mouth daily.    [provider]  losartan (COZAAR) 25 MG tablet Take 1 tablet (25 mg total) by mouth daily. 06/22/18 09/20/18  Minus Breeding, MD  naproxen sodium (ANAPROX) 220 MG tablet Take 220 mg by mouth 2 (two) times daily with a meal.    [provider]  niacin (NIASPAN) 1000 MG CR tablet Take 1,000 mg by mouth at bedtime.    [provider]  nitroGLYCERIN (NITROSTAT) 0.4 MG SL tablet Place 0.4 mg under the tongue every 5 (five) minutes as needed for chest pain.     [provider]  Omega-3 Fatty Acids (FISH OIL) 1000 MG CAPS Take 1,000 mg by mouth daily.     [provider]  omeprazole (PRILOSEC OTC) 20 MG tablet Take 20 mg by mouth daily.    [provider]    Allergies    Patient has no known allergies.  Review of Systems   Review of Systems  Constitutional: Negative for chills and fever.  Respiratory: Negative for shortness of breath.   Cardiovascular: Negative for chest pain.  Gastrointestinal: Positive for abdominal distention. Negative for abdominal pain, blood in stool, constipation, diarrhea, nausea and vomiting.  Genitourinary: Positive for difficulty urinating and hematuria (previously, resolved). Negative for discharge, dysuria, penile swelling, scrotal swelling and testicular pain.  Neurological: Negative for syncope.  All other systems reviewed and are negative.   Physical Exam Updated Vital Signs BP (!) 147/79 (BP Location: Left Arm)   Pulse 67   Temp 97.7 F (36.5 C)   Resp 16   Ht 5\' 10"  (1.778 m)   Wt 107 kg   SpO2 96%   BMI 33.86 kg/m   Physical Exam Vitals and nursing note reviewed.  Constitutional:      General: He is not in acute distress.    Appearance: He is well-developed. He is not toxic-appearing.  HENT:     Head: Normocephalic and atraumatic.  Eyes:     General:        Right eye: No discharge.        Left eye:  No discharge.     Conjunctiva/sclera: Conjunctivae normal.  Cardiovascular:     Rate and Rhythm: Normal rate and regular rhythm.  Pulmonary:     Effort: Pulmonary effort is normal. No respiratory distress.     Breath sounds: Normal breath sounds. No wheezing, rhonchi or rales.  Abdominal:     General: There is no distension.     Palpations: Abdomen is soft.     Tenderness: There is no abdominal tenderness. There is no guarding or rebound.    Musculoskeletal:     Cervical back: Neck supple.  Skin:    General: Skin is warm and dry.     Findings: No rash.  Neurological:     Mental Status: He is alert.     Comments: Clear speech.   Psychiatric:        Behavior: Behavior normal.     ED Results / Procedures / Treatments   Labs (all labs ordered are listed, but only abnormal results are displayed) Labs Reviewed  URINALYSIS, ROUTINE W REFLEX MICROSCOPIC - Abnormal; Notable for the following components:      Result Value   Glucose, UA 150 (*)    All other components within normal limits  COMPREHENSIVE METABOLIC PANEL - Abnormal; Notable for the following components:   Glucose, Bld 182 (*)    Creatinine, Ser 1.34 (*)    GFR calc non Af Amer 51 (*)    GFR calc Af Amer 59 (*)    All other components within normal limits  CBC WITH DIFFERENTIAL/PLATELET - Abnormal; Notable for the following components:   WBC 11.2 (*)    Lymphs Abs 5.4 (*)    All other components within normal limits  URINE CULTURE  LIPASE, BLOOD    EKG None  Radiology CT Abdomen Pelvis W Contrast  Result Date: 03/06/2019 CLINICAL DATA:  Left abdominal distension. EXAM: CT ABDOMEN AND PELVIS WITH CONTRAST TECHNIQUE: Multidetector CT imaging of the abdomen and pelvis was performed using the standard protocol following bolus administration of intravenous contrast. CONTRAST:  166mL OMNIPAQUE IOHEXOL 300 MG/ML  SOLN COMPARISON:  February 13, 2019 FINDINGS: Lower chest: No acute abnormality. Hepatobiliary: An 8 mm  focus of parenchymal low attenuation is seen within the posterior aspect of the right lobe of the liver. No gallstones, gallbladder wall thickening, or biliary dilatation. Pancreas: Unremarkable. No pancreatic ductal dilatation or surrounding inflammatory changes. Spleen: Normal in size without focal abnormality. Adrenals/Urinary Tract: Adrenal glands are unremarkable. Kidneys are normal in size, without renal calculi or hydronephrosis. 2.4 cm and 6.7 cm right renal cysts are seen. A 1.2 cm urinary bladder diverticulum is seen along the posterolateral aspect of the urinary bladder on the left. Stomach/Bowel: There is a small hiatal hernia. Appendix appears normal. No evidence of bowel wall thickening, distention, or inflammatory changes. Noninflamed diverticula are seen throughout the large bowel. Vascular/Lymphatic: Marked severity aortic atherosclerosis. No enlarged abdominal or pelvic lymph nodes. Reproductive: The prostate gland is mildly enlarged. Other: There is a 4.4 cm x 3.0 cm fat containing right inguinal hernia. An additional 4.0 cm x 3.0 cm fat containing left inguinal hernia is seen. Multiple surgical coils are seen along the anterior aspect of the mid and lower pelvis. Musculoskeletal: Degenerative changes seen throughout the lumbar spine. IMPRESSION: 1. Diverticulosis without evidence of diverticulitis. 2. Small hiatal hernia. 3. Bilateral fat containing inguinal hernias. 4. Small urinary bladder diverticulum. 5. Marked severity aortic atherosclerosis. Aortic Atherosclerosis (ICD10-I70.0). Electronically Signed   By: Virgina Norfolk M.D.   On: 03/06/2019 21:37    Procedures Procedures (including critical care time)  Medications Ordered in ED Medications  sodium chloride (PF) 0.9 % injection (has no administration in time range)  iohexol (OMNIPAQUE) 300 MG/ML solution 100 mL (100 mLs Intravenous Contrast Given 03/06/19 2105)    ED Course  I have reviewed the triage vital signs and the  nursing notes.  Pertinent labs & imaging results that were available during my care of the patient were reviewed by me and considered in my medical decision making (see chart for details).    MDM Rules/Calculators/A&P  Patient presents to the emergency department with concern for painless left-sided abdominal swelling.  Nontoxic, resting comfortably, vitals WNL with the exception of elevated BP- doubt HTN emergency. On exam patient does have some asymmetric swelling noted but this is nontender and is not firm to palpation, no overlying skin changes. Plan for labs & CT A/P. Patient also with some trouble initiating urination but reports good stream and emptying. Per nursing staff 50 cc of urine noted on bladder scan, patient without urgency to urinate currently, does not appear to need Foley catheter at this time.   CBC: Mild leukocytosis felt to be nonspecific. No anemia CMP: Mildly elevated creatinine- PCP recheck. NO significant electrolyte derangement. LFTs WNL Lipase: WNL UA: NO infection or hematuria.  CT A/P:  1. Diverticulosis without evidence of diverticulitis. 2. Small hiatal hernia. 3. Bilateral fat containing inguinal hernias. 4. Small urinary bladder diverticulum. 5. Marked severity aortic atherosclerosis. Aortic Atherosclerosis   No acute explanation for mild swelling, again no skin changes to indicate cellulitis or shingles, not firm or tender to palpation on repeat abdominal exam, CT reassuring, patient continues to urinate in the ED. Overall reassuring work-up appears appropriate for discharge home with PCP/urology follow up. I discussed results, treatment plan, need for follow-up, and return precautions with the patient. Provided opportunity for questions, patient confirmed understanding and is in agreement with plan.   Findings and plan of care discussed with supervising physician Dr. Roslynn Amble who has evaluated patient & is in agreement.   Final Clinical  Impression(s) / ED Diagnoses Final diagnoses:  Abdominal swelling  Difficulty urinating    Rx / DC Orders ED Discharge Orders    None       Amaryllis Dyke, PA-C 03/06/19 2235    Lucrezia Starch, MD 03/07/19 1250

## 2019-03-08 LAB — URINE CULTURE: Culture: 10000 — AB

## 2019-03-09 ENCOUNTER — Telehealth: Payer: Self-pay | Admitting: Emergency Medicine

## 2019-03-09 NOTE — Telephone Encounter (Signed)
Post ED Visit - Positive Culture Follow-up  Culture report reviewed by antimicrobial stewardship pharmacist: Central Team []  Elenor Quinones, Pharm.D. []  Heide Guile, Pharm.D., BCPS AQ-ID []  Parks Neptune, Pharm.D., BCPS []  Alycia Rossetti, Pharm.D., BCPS []  [place], [place].D., BCPS, AAHIVP []  Legrand Como, Pharm.D., BCPS, AAHIVP []  Salome Arnt, PharmD, BCPS []  Johnnette Gourd, PharmD, BCPS []  Hughes Better, PharmD, BCPS []  Leeroy Cha, PharmD []  Laqueta Linden, PharmD, BCPS []  Albertina Parr, PharmD  [facility] Team []  Leodis Sias, PharmD []  Lindell Spar, PharmD []  Royetta Asal, PharmD []  Graylin Shiver, Rph []  Rema Fendt) Glennon Mac, PharmD []  Arlyn Dunning, PharmD []  Netta Cedars, PharmD []  Dia Sitter, PharmD []  Leone Haven, PharmD []  Gretta Arab, PharmD []  Theodis Shove, PharmD []  Peggyann Juba, PharmD [x]  Reuel Boom, PharmD   Positive urine culture No further patient follow-up is required at this time.  [name] 03/09/2019, 4:46 PM

## 2019-03-11 ENCOUNTER — Ambulatory Visit: Payer: Medicare Other

## 2019-03-17 ENCOUNTER — Ambulatory Visit: Payer: Medicare Other

## 2019-11-22 ENCOUNTER — Other Ambulatory Visit (HOSPITAL_COMMUNITY): Payer: Self-pay | Admitting: Family

## 2019-11-22 ENCOUNTER — Telehealth (HOSPITAL_COMMUNITY): Payer: Self-pay | Admitting: Emergency Medicine

## 2019-11-22 DIAGNOSIS — U071 COVID-19: Secondary | ICD-10-CM

## 2019-11-22 NOTE — Telephone Encounter (Signed)
Pt spouse, Artrell Lawless [phone], called and requested pt be given monoclonal antibody treatment ASAP. I explained possible monoclonal antibody treatment. Pt sx started 11/17/19. Tested positive 11/22/19 at [facility]. Sx include shortness of breath, fatigue, and cough. Qualifying risk factors CAD, HTN, COPD, pulmonary nodule, BMI >25, TIA, and encephalopathy. Pt interested in tx. Informed pt an APP will call back to possibly schedule an appointment.

## 2019-11-22 NOTE — Progress Notes (Signed)
I connected by phone with Todd Cummings on 11/22/2019 at 7:09 PM to discuss the potential use of a new treatment for mild to moderate COVID-19 viral infection in non-hospitalized patients.  This patient is a 79 y.o. male that meets the FDA criteria for Emergency Use Authorization of COVID monoclonal antibody casirivimab/imdevimab or bamlanivimab/eteseviamb.  Has a (+) direct SARS-CoV-2 viral test result  Has mild or moderate COVID-19   Is NOT hospitalized due to COVID-19  Is within 10 days of symptom onset  Has at least one of the high risk factor(s) for progression to severe COVID-19 and/or hospitalization as defined in EUA.  Specific high risk criteria : Older age (>/= 77 yo) and Cardiovascular disease or hypertension Symptoms of SOB, fatigue, body aches and poor appetite began 11/18/19 per patient.   I have spoken and communicated the following to the patient or parent/caregiver regarding COVID monoclonal antibody treatment:  1. FDA has authorized the emergency use for the treatment of mild to moderate COVID-19 in adults and pediatric patients with positive results of direct SARS-CoV-2 viral testing who are 99 years of age and older weighing at least 40 kg, and who are at high risk for progressing to severe COVID-19 and/or hospitalization.  2. The significant known and potential risks and benefits of COVID monoclonal antibody, and the extent to which such potential risks and benefits are unknown.  3. Information on available alternative treatments and the risks and benefits of those alternatives, including clinical trials.  4. Patients treated with COVID monoclonal antibody should continue to self-isolate and use infection control measures (e.g., wear mask, isolate, social distance, avoid sharing personal items, clean and disinfect "high touch" surfaces, and frequent handwashing) according to CDC guidelines.   5. The patient or parent/caregiver has the option to accept or refuse COVID  monoclonal antibody treatment.  After reviewing this information with the patient, the patient has agreed to receive one of the available covid 19 monoclonal antibodies and will be provided an appropriate fact sheet prior to infusion.  [name], NP 11/22/2019 7:09 PM

## 2019-11-23 ENCOUNTER — Other Ambulatory Visit (HOSPITAL_COMMUNITY): Payer: Self-pay

## 2019-11-23 ENCOUNTER — Ambulatory Visit (HOSPITAL_COMMUNITY)
Admission: RE | Admit: 2019-11-23 | Discharge: 2019-11-23 | Disposition: A | Discharge status: Home / Self Care | Payer: Medicare Other | Source: Ambulatory Visit | Attending: Pulmonary Disease | Admitting: Pulmonary Disease

## 2019-11-23 DIAGNOSIS — U071 COVID-19: Secondary | ICD-10-CM | POA: Diagnosis present

## 2019-11-23 DIAGNOSIS — Z23 Encounter for immunization: Secondary | ICD-10-CM | POA: Insufficient documentation

## 2019-11-23 MED ORDER — DIPHENHYDRAMINE HCL 50 MG/ML IJ SOLN: 50.0000 mg | Freq: Once | INTRAMUSCULAR | Status: DC | PRN

## 2019-11-23 MED ORDER — EPINEPHRINE 0.3 MG/0.3ML IJ SOAJ: 0.3000 mg | Freq: Once | INTRAMUSCULAR | Status: DC | PRN

## 2019-11-23 MED ORDER — METHYLPREDNISOLONE SODIUM SUCC 125 MG IJ SOLR: 125.0000 mg | Freq: Once | INTRAMUSCULAR | Status: DC | PRN

## 2019-11-23 MED ORDER — SODIUM CHLORIDE 0.9 % IV SOLN: Freq: Once | INTRAVENOUS | Status: AC

## 2019-11-23 MED ORDER — ALBUTEROL SULFATE HFA 108 (90 BASE) MCG/ACT IN AERS: 2.0000 | INHALATION_SPRAY | Freq: Once | RESPIRATORY_TRACT | Status: DC | PRN

## 2019-11-23 MED ORDER — SODIUM CHLORIDE 0.9 % IV SOLN: INTRAVENOUS | Status: DC | PRN

## 2019-11-23 MED ORDER — ACETAMINOPHEN 325 MG PO TABS
650.0000 mg | ORAL_TABLET | Freq: Four times a day (QID) | ORAL | Status: DC | PRN
  Administered 2019-11-23: 650 mg via ORAL
  Filled 2019-11-23: qty 2

## 2019-11-23 MED ORDER — FAMOTIDINE IN NACL 20-0.9 MG/50ML-% IV SOLN: 20.0000 mg | Freq: Once | INTRAVENOUS | Status: DC | PRN

## 2019-11-23 NOTE — Discharge Instructions (Signed)

## 2019-11-23 NOTE — Progress Notes (Signed)
  Diagnosis: COVID-19  Physician:Dr. Joya Gaskins   Procedure: [facility] Med: bamlanivimab\etesevimab infusion - Provided patient with bamlanimivab\etesevimab fact sheet for patients, parents and caregivers prior to infusion.  Complications: No immediate complications noted.  Discharge: Discharged home   Todd Cummings 11/23/2019

## 2020-06-08 ENCOUNTER — Other Ambulatory Visit: Payer: Self-pay | Admitting: Internal Medicine

## 2020-06-08 DIAGNOSIS — R413 Other amnesia: Secondary | ICD-10-CM

## 2020-06-23 ENCOUNTER — Ambulatory Visit
Admission: RE | Admit: 2020-06-23 | Discharge: 2020-06-23 | Disposition: A | Discharge status: Home / Self Care | Payer: Medicare Other | Source: Ambulatory Visit | Attending: Internal Medicine | Admitting: Internal Medicine

## 2020-06-23 DIAGNOSIS — R413 Other amnesia: Secondary | ICD-10-CM

## 2020-07-14 ENCOUNTER — Ambulatory Visit: Payer: Medicare Other | Admitting: Neurology

## 2020-07-14 ENCOUNTER — Encounter: Payer: Self-pay | Admitting: Neurology

## 2020-07-14 VITALS — BP 122/71 | HR 59 | Ht 70.0 in | Wt 238.0 lb

## 2020-07-14 DIAGNOSIS — R0689 Other abnormalities of breathing: Secondary | ICD-10-CM | POA: Insufficient documentation

## 2020-07-14 DIAGNOSIS — J189 Pneumonia, unspecified organism: Secondary | ICD-10-CM | POA: Insufficient documentation

## 2020-07-14 DIAGNOSIS — R0683 Snoring: Secondary | ICD-10-CM | POA: Diagnosis not present

## 2020-07-14 DIAGNOSIS — G4719 Other hypersomnia: Secondary | ICD-10-CM

## 2020-07-14 DIAGNOSIS — F518 Other sleep disorders not due to a substance or known physiological condition: Secondary | ICD-10-CM | POA: Diagnosis not present

## 2020-07-14 DIAGNOSIS — G459 Transient cerebral ischemic attack, unspecified: Secondary | ICD-10-CM | POA: Diagnosis not present

## 2020-07-14 DIAGNOSIS — R0602 Shortness of breath: Secondary | ICD-10-CM

## 2020-07-14 DIAGNOSIS — J449 Chronic obstructive pulmonary disease, unspecified: Secondary | ICD-10-CM

## 2020-07-14 DIAGNOSIS — I10 Essential (primary) hypertension: Secondary | ICD-10-CM

## 2020-07-14 NOTE — Progress Notes (Addendum)
SLEEP MEDICINE CLINIC    Provider:  [name], MD  Primary Care Physician:  Crist Infante, MD [address] [place] [address]     Referring Provider: Crist Infante, [place],  [place] [address]          Chief Complaint according to patient   Patient presents with:    . New Patient (Initial Visit)           HISTORY OF PRESENT ILLNESS:  Todd Cummings is a 80 y.o. year old White or Caucasian male patient seen here as a referral on 07/14/2020 from Dr Joylene Draft- Chief concern according to patient :  explained by his wife, Todd Cummings.  She feels he has still snoring and poor sleep quality, he gained weight. He thinks he may have had a sleep study in the past but it was more than ten years ago. He does not remember the results. Says it was completed at [facility]. Never used CPAP machine. Here for snoring.     Todd Cummings  has a past medical history of TIA - 2015, STROKE MD,  Arthritis, BPH (benign prostatic hypertrophy), Bronchospasm, Chronic sinusitis, DDD (degenerative disc disease), lumbar, Esophageal stricture, GERD (gastroesophageal reflux disease), Heart disease, HLD (hyperlipidemia), HTN (hypertension), Kidney stones, and Snoring.he has undergone a deviated septum repair.     Sleep relevant medical history: COPD, Nocturia 1-2, short sleeper syndrome, average 4-5 hours of sleep time, joint pain, GERD, deviated septum repair.   Family medical /sleep history: No other family member on CPAP with OSA, insomnia, sleep walkers.    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   and lives in a household with spouse,  with adult children, and grandchildren- granddaughter just graduated HS. .  Tobacco use: until 15 years ago . ETOH use ; used to drink more often , now 2-4 drinks a week.  Caffeine intake .Regular exercise in form of walking.    Hobbies : had to give up golfing. Fishing.       Sleep habits are as follows: The  patient's dinner time is between 4.15- 6.15 PM.  The patient goes to bed at 9.30 PM and continues to sleep for 3-5 hours, may be interrupted after 3 hours, wakes up and watches TV, separate bedrooms.  The preferred sleep position is prone, with the support of 1-2 pillows.  Dreams are reportedly rare.  4-5  AM is the usual Todd time. The patient wakes up spontaneously.  He  reports not feeling refreshed or restored in AM, with symptoms such as dry mouth phlegm, and residual fatigue.  Naps were not taken usually, but he stared with weight gain- now frequently, lasting from 15-30 minutes and are more refreshing than nocturnal sleep.    Review of Systems: Out of a complete 14 system review, the patient complains of only the following symptoms, and all other reviewed systems are negative.:  Fatigue, sleepiness , snoring, fragmented sleep, short sleep.    How likely are you to doze in the following situations: 0 = not likely, 1 = slight chance, 2 = moderate chance, 3 = high chance   Sitting and Reading?2 Watching Television?3 Sitting inactive in a public place (theater or meeting)?2 As a passenger in a car for an hour without a break?0 Lying down in the afternoon when circumstances permit?3 Sitting and talking to someone?0 Sitting quietly after lunch without alcohol?2-3 In a car,  while stopped for a few minutes in traffic?0   Total = 15/ 24 points   FSS endorsed at 37/ 63 points.   Social History   Socioeconomic History  . Marital status: Married    Spouse name: Not on file  . Number of children: 3  . Years of education: some college  . Highest education level: Not on file  Occupational History  . 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 Smoking status: Former Smoker    Packs/day: 0.50    Years: 35.00    Pack years: 17.50    Quit date: 01/07/2002    Years since quitting: 18.5  . Smokeless tobacco: Former Network engineer  . Vaping Use: Never used  Substance and  Sexual Activity  . Alcohol use: Yes    Comment: 1-2 drinks per week  . Drug use: Never  . Sexual activity: Yes  Other Topics Concern  . Not on file  Social History Narrative   Patient lives at home with wife.   Right-handed.   Rare caffeine use.   Social Determinants of Health   Financial Resource Strain: Not on file  Food Insecurity: Not on file  Transportation Needs: Not on file  Physical Activity: Not on file  Stress: Not on file  Social Connections: Not on file    Family History  Adopted: Yes  Family history unknown: Yes    Past Medical History:  Diagnosis Date  . Arthritis   . BPH (benign prostatic hypertrophy)   . Bronchospasm   . Chronic sinusitis   . DDD (degenerative disc disease), lumbar   . Esophageal stricture   . GERD (gastroesophageal reflux disease)   . Heart disease   . HLD (hyperlipidemia)   . HTN (hypertension)   . Kidney stones   . Snoring     Past Surgical History:  Procedure Laterality Date  . CATARACT EXTRACTION Bilateral   . COLONOSCOPY    . ESOPHAGEAL DILATION  10/02  . ESOPHAGOGASTRODUODENOSCOPY (EGD) WITH PROPOFOL N/A 01/16/2015   Procedure: ESOPHAGOGASTRODUODENOSCOPY (EGD) WITH PROPOFOL;  Surgeon: Laurence Spates, MD;  Location: [facility];  Service: Endoscopy;  Laterality: N/A;  . HEEL SPUR SURGERY  4/02  . NASAL SINUS SURGERY    . SAVORY DILATION N/A 01/16/2015   Procedure: SAVORY DILATION;  Surgeon: Laurence Spates, MD;  Location: [facility];  Service: Endoscopy;  Laterality: N/A;  . TRANSURETHRAL RESECTION OF PROSTATE  6/02     Current Outpatient Medications on File Prior to Visit  Medication Sig Dispense Refill  . acetaminophen (TYLENOL) 650 MG CR tablet Take 650 mg by mouth daily as needed for pain.    Marland Kitchen albuterol (VENTOLIN HFA) 108 (90 Base) MCG/ACT inhaler Inhale 2 puffs into the lungs every 6 (six) hours as needed for wheezing or shortness of breath.    Marland Kitchen aspirin 81 MG tablet Take 81 mg by mouth at bedtime.    Marland Kitchen  atorvastatin (LIPITOR) 80 MG tablet Take 80 mg by mouth every evening.    . chlorthalidone (HYGROTON) 25 MG tablet Take 25 mg by mouth daily.    Marland Kitchen donepezil (ARICEPT) 5 MG tablet Take 5 mg by mouth at bedtime.    . finasteride (PROSCAR) 5 MG tablet Take 5 mg by mouth 2 (two) times a day.     . fluticasone (FLONASE) 50 MCG/ACT nasal spray Place 2 sprays into both nostrils daily as needed for allergies.     Marland Kitchen loratadine (CLARITIN) 10 MG tablet Take  10 mg by mouth daily.    Marland Kitchen losartan (COZAAR) 100 MG tablet Take 100 mg by mouth at bedtime.    . Melatonin 10 MG TABS Take 10 mg by mouth at bedtime as needed.    . metFORMIN (GLUCOPHAGE-XR) 500 MG 24 hr tablet Take 1,000 mg by mouth 2 (two) times daily.    . naproxen sodium (ANAPROX) 220 MG tablet Take 220 mg by mouth 2 (two) times daily with a meal.    . niacin (NIASPAN) 1000 MG CR tablet Take 1,000 mg by mouth at bedtime.    . Omega-3 Fatty Acids (FISH OIL) 1000 MG CAPS Take 2,000 mg by mouth daily.     Marland Kitchen omeprazole (PRILOSEC OTC) 20 MG tablet Take 20 mg by mouth daily.    . vitamin C (ASCORBIC ACID) 500 MG tablet Take 500 mg by mouth daily.    . Zinc 30 MG TABS Take 1 tablet by mouth daily.     No current facility-administered medications on file prior to visit.    No Known Allergies  Physical exam:  Today's Vitals   07/14/20 0850  BP: 122/71  Pulse: (!) 59  Weight: 238 lb (108 kg)  Height: 5\' 10"  (1.778 m)   Body mass index is 34.15 kg/m.   Wt Readings from Last 3 Encounters:  07/14/20 238 lb (108 kg)  03/06/19 236 lb (107 kg)  09/21/18 236 lb (107 kg)     Ht Readings from Last 3 Encounters:  07/14/20 5\' 10"  (1.778 m)  03/06/19 5\' 10"  (1.778 m)  09/21/18 5\' 10"  (1.778 m)      General: The patient is awake, alert and appears not in acute distress. The patient is well groomed. Head: Normocephalic, atraumatic. Neck is supple. Mallampati  3  neck circumference:19.5  Inches. Sinus pressure reported,  Nasal airflow patent-  after surgery .  Retrognathia is not seen.  Dental status:  Cardiovascular:  Regular rate and cardiac rhythm by pulse,  without distended neck veins. Respiratory: Lungs are clear to auscultation.  Skin:  Without evidence of ankle edema, or rash. Trunk: The patient's posture is erect.   Neurologic exam : The patient is awake and alert, oriented to place and time.   Memory subjective described as intact.  Attention span & concentration ability appears normal.  Speech is fluent,  without  dysarthria, dysphonia or aphasia.  Mood and affect are appropriate.   Cranial nerves: no loss of smell or taste reported  Pupils are equal and briskly reactive to light. Funduscopic exam deferred. .  Extraocular movements in vertical and horizontal planes were intact and without nystagmus. No Diplopia. Visual fields by finger perimetry are intact. Hearing was intact to soft voice and finger rubbing.   Facial sensation intact to fine touch.  Facial motor strength is symmetric and tongue  moved midline.  Neck ROM : rotation, tilt and flexion extension were normal for age and shoulder shrug was symmetrical.    Motor exam:  Symmetric bulk, tone and ROM.   Normal tone without cog- wheeling, symmetric grip strength .   Sensory:  Fine touch and vibration were decreased in al toes, not ankles.  Proprioception tested in the upper extremities was normal.   Coordination: Rapid alternating movements in the fingers/hands were of normal speed.  The Finger-to-nose maneuver was intact without evidence of ataxia, dysmetria or tremor.   Gait and station: Patient could Todd unassisted from a seated position, walked without assistive device.  Stance is of normal width/  base and the patient turned with 3 steps.  Toe and heel walk were deferred.  Deep tendon reflexes: in the  upper and lower extremities are symmetric and intact.  Babinski response was deferred.        After spending a total time of minutes face to  face and additional time for physical and neurologic examination, review of laboratory studies,  personal review of imaging studies, reports and results of other testing and review of referral information / records as far as provided in visit, I have established the following assessments:  1) snoring and high grade airway narrowing , obesity and neck size-.high risk for OSA. Had several repeat pneumonia in 2020, non- Covid, COPD.  2) short sleeper habitually all his life, prone sleeper. 3) EDS excessive daytime sleepiness reported by wife at [place] 15 , him at 12.    My Plan is to proceed with:  1) SPLIT test- or HST.  2) Melatonin 5 mg or less.  3) try to replace TV with a non- light emitting background noise.  I would like to thank Crist Infante, MD [address] [place],   [address] for allowing me to meet with and to take care of this pleasant patient.    I plan to follow up either personally or through our NP within 2-4  month.   CC: I will share my notes with PCP.   Electronically signed by: [name], MD 07/14/2020 9:23 AM  [facility] and [employer] [facility] Board certified by The [employer] of Sleep Medicine and Diplomate of the [employer] of Sleep Medicine. Board certified In Neurology through the [place], Fellow of the [employer] of Neurology. Medical Director of [employer].

## 2020-07-14 NOTE — Patient Instructions (Addendum)
After spending a total time of minutes face to face and additional time for physical and neurologic examination, review of laboratory studies,  personal review of imaging studies, reports and results of other testing and review of referral information / records as far as provided in visit, I have established the following assessments:  1) snoring and high grade airway narrowing , obesity and neck size-.high risk for OSA. Had several repeat pneumonia in 2020, non- Covid, COPD.  2) short sleeper habitually all his life, prone sleeper. 3) EDS excessive daytime sleepiness reported by wife at [place] 15 , him at 12.    My Plan is to proceed with:  1) SPLIT test- or HST.  2) Melatonin 5 mg or less.  3) try to replace TV with a non- light emitting background noise.     Screening for Sleep Apnea  Sleep apnea is a condition in which breathing pauses or becomes shallow during sleep. Sleep apnea screening is a test to determine if you are at risk for sleep apnea. The test is easy and only takes a few minutes. Your health care provider may ask you to have this test in preparation for surgery or as part of a physical exam. What are the symptoms of sleep apnea? Common symptoms of sleep apnea include:  Snoring.  Restless sleep.  Daytime sleepiness.  Pauses in breathing.  Choking during sleep.  Irritability.  Forgetfulness.  Trouble thinking clearly.  Depression.  Personality changes. Most people with sleep apnea are not aware that they have it. Why should I get screened? Getting screened for sleep apnea can help:  Ensure your safety. It is important for your health care providers to know whether or not you have sleep apnea, especially if you are having surgery or have other long-term (chronic) health conditions.  Improve your health and allow you to get a better night's rest. Restful sleep can help you: ? Have more energy. ? Lose weight. ? Improve high blood pressure. ? Improve  diabetes management. ? Prevent stroke. ? Prevent car accidents. How is screening done? Screening usually includes being asked a list of questions about your sleep quality. Some questions you may be asked include:  Do you snore?  Is your sleep restless?  Do you have daytime sleepiness?  Has a partner or spouse told you that you stop breathing during sleep?  Have you had trouble concentrating or memory loss? If your screening test is positive, you are at risk for the condition. Further testing may be needed to confirm a diagnosis of sleep apnea. Where to find more information You can find screening tools online or at your health care clinic. For more information about sleep apnea screening and healthy sleep, visit these websites:  Centers for Disease Control and Prevention: LearningDermatology.pl  American Sleep Apnea Association: www.sleepapnea.org Contact a health care provider if:  You think that you may have sleep apnea. Summary  Sleep apnea screening can help determine if you are at risk for sleep apnea.  It is important for your health care providers to know whether or not you have sleep apnea, especially if you are having surgery or have other chronic health conditions.  You may be asked to take a screening test for sleep apnea in preparation for surgery or as part of a physical exam. This information is not intended to replace advice given to you by your health care provider. Make sure you discuss any questions you have with your health care provider. Document Revised: 11/10/2017 Document  Reviewed: 05/06/2016 Elsevier Patient Education  [place].

## 2020-07-17 ENCOUNTER — Ambulatory Visit: Payer: Medicare Other

## 2020-07-17 NOTE — Progress Notes (Signed)
   Covid-19 Vaccination Clinic  Name:  [name]    MRN: [id number] DOB: [birth year]  07/17/2020  Mr. Rabalais was observed post Covid-19 immunization for 15 minutes without incident. He was provided with Vaccine Information Sheet and instruction to access the V-Safe system.   Mr. Bonelli was instructed to call 911 with any severe reactions post vaccine: Difficulty breathing  Swelling of face and throat  A fast heartbeat  A bad rash all over body  Dizziness and weakness

## 2020-07-21 ENCOUNTER — Other Ambulatory Visit (HOSPITAL_BASED_OUTPATIENT_CLINIC_OR_DEPARTMENT_OTHER): Payer: Self-pay

## 2020-07-21 MED ORDER — COVID-19 MRNA VAC-TRIS(PFIZER) 30 MCG/0.3ML IM SUSP
INTRAMUSCULAR | 0 refills | Status: DC
  Filled 2020-07-21: qty 0.3, 1d supply, fill #0

## 2020-07-28 ENCOUNTER — Telehealth: Payer: Self-pay

## 2020-07-28 NOTE — Telephone Encounter (Signed)
LVM for pt to call me back to schedule sleep study  

## 2020-08-05 ENCOUNTER — Ambulatory Visit (INDEPENDENT_AMBULATORY_CARE_PROVIDER_SITE_OTHER): Payer: Medicare Other | Admitting: Neurology

## 2020-08-05 DIAGNOSIS — G4733 Obstructive sleep apnea (adult) (pediatric): Secondary | ICD-10-CM

## 2020-08-05 DIAGNOSIS — G4719 Other hypersomnia: Secondary | ICD-10-CM

## 2020-08-05 DIAGNOSIS — J449 Chronic obstructive pulmonary disease, unspecified: Secondary | ICD-10-CM

## 2020-08-05 DIAGNOSIS — G459 Transient cerebral ischemic attack, unspecified: Secondary | ICD-10-CM

## 2020-08-05 DIAGNOSIS — F518 Other sleep disorders not due to a substance or known physiological condition: Secondary | ICD-10-CM

## 2020-08-05 DIAGNOSIS — R0689 Other abnormalities of breathing: Secondary | ICD-10-CM

## 2020-08-05 DIAGNOSIS — R0683 Snoring: Secondary | ICD-10-CM

## 2020-08-05 DIAGNOSIS — R0602 Shortness of breath: Secondary | ICD-10-CM

## 2020-08-05 DIAGNOSIS — I10 Essential (primary) hypertension: Secondary | ICD-10-CM

## 2020-08-06 NOTE — Progress Notes (Signed)
   [facility] TEST (Watch PAT) REPORT  STUDY DATE: 08-05-20  DOB: 05-18-1940  MRN: [id number]  ORDERING CLINICIAN: Cindy Hazy, MD   REFERRING CLINICIAN: Crist Infante, MD   CLINICAL INFORMATION/HISTORY: Todd Cummings is a 80  year old patient seen here  07/14/2020 , upon referral from Dr Joylene Draft- Chief concern according to patient :  Explained by his wife, [name]: She feels he has still snoring and poor sleep quality, he gained weight. He thinks he may have had a sleep study in the past but it was more than ten years ago and does not remember the results. ? at [facility]. Never used a CPAP machine. AUSTEN OYSTER  has a past medical history of TIA - 2015, STROKE MD,  Arthritis, BPH (benign prostatic hypertrophy), Bronchospasm, Chronic sinusitis, DDD (degenerative disc disease), lumbar, Esophageal stricture, GERD (gastroesophageal reflux disease), Heart disease, HLD (hyperlipidemia), HTN (hypertension), Kidney stones, EDS and Snoring- he has undergone a deviated septum repair.   Epworth sleepiness score: 15/24. BMI: 34.1 kg/m Neck Circumference: 19.5"  Sleep Summary:   Total Recording Time (hours, min): 8 h 35 min Total Sleep Time (hours, min):  7 h 2 min  Percent REM (%):    42.2 %  Respiratory Indices:   Calculated pAHI (per hour):  49.3/hour         REM pAHI:      0.0/hour       NREM pAHI:   53.1/hour  Supine AHI: 42.2/ hour and non-supine AHI: 128.7/h  Oxygen Saturation Statistics:    Oxygen Saturation (%) Mean: 94%   Minimum oxygen saturation (%):       87%   O2 Saturation Range (%):  87 - 99%    O2 Saturation (minutes) <88%: 0.3 min  Pulse Rate Statistics:   Pulse Mean (bpm):   64/min    Pulse Range: 35 - 95/min.   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  There was loud snoring noted. I was surprised not to find hypoxia in a patient  with COPD.   RECOMMENDATION: attended sleep study for titration BiPAP or CPAP in light of the high risk of central apnea being present.    INTERPRETING PHYSICIAN:  [name], MD    [facility] and [facility] Sleep Board certified by The [employer] of Sleep Medicine and Diplomate of the [employer] of Sleep Medicine. Board certified In Neurology through the [place], Fellow of the [employer] of Neurology. Medical Director of [employer].

## 2020-08-20 ENCOUNTER — Encounter: Payer: Self-pay | Admitting: Neurology

## 2020-08-20 NOTE — Addendum Note (Signed)
Addended by: [name] on: 08/20/2020 05:47 PM   Modules accepted: Orders

## 2020-08-20 NOTE — Procedures (Signed)
[facility] TEST (Watch PAT) REPORT  STUDY DATE: 08-05-20  DOB: 1940-07-28  MRN: [id number]  ORDERING CLINICIAN: Cindy Hazy, MD   REFERRING CLINICIAN: Crist Infante, MD   CLINICAL INFORMATION/HISTORY: Todd Cummings is a 80  year old patient seen here  07/14/2020 , upon referral from Dr Joylene Draft- Chief concern according to patient :  Explained by his wife, [name]: She feels he has still snoring and poor sleep quality, he gained weight. He thinks he may have had a sleep study in the past but it was more than ten years ago and does not remember the results. ? at [facility]. Never used a CPAP machine. Todd Cummings  has a past medical history of TIA - 2015, STROKE MD,  Arthritis, BPH (benign prostatic hypertrophy), Bronchospasm, Chronic sinusitis, DDD (degenerative disc disease), lumbar, Esophageal stricture, GERD (gastroesophageal reflux disease), Heart disease, HLD (hyperlipidemia), HTN (hypertension), Kidney stones, EDS and Snoring- he has undergone a deviated septum repair.   Epworth sleepiness score: 15/24. BMI: 34.1 kg/m Neck Circumference: 19.5"  Sleep Summary:   Total Recording Time (hours, min): 8 h 35 min Total Sleep Time (hours, min):  7 h 2 min  Percent REM (%):    42.2 %  Respiratory Indices:   Calculated pAHI (per hour):  49.3/hour         REM pAHI:      0.0/hour       NREM pAHI:   53.1/hour  Supine AHI: 42.2/ hour and non-supine AHI: 128.7/h  Oxygen Saturation Statistics:    Oxygen Saturation (%) Mean: 94%   Minimum oxygen saturation (%):       87%   O2 Saturation Range (%):  87 - 99%    O2 Saturation (minutes) <88%: 0.3 min  Pulse Rate Statistics:   Pulse Mean (bpm):   64/min    Pulse Range: 35 - 95/min.   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  There was loud snoring noted. I was surprised not to find hypoxia in a patient with  COPD.   RECOMMENDATION: attended sleep study for titration BiPAP or CPAP in light of the high risk of central apnea being present.    INTERPRETING PHYSICIAN:  [name], MD    [facility] and [facility] Sleep Board certified by The [employer] of Sleep Medicine and Diplomate of the [employer] of Sleep Medicine. Board certified In Neurology through the [place], Fellow of the [employer] of Neurology. Medical Director of [employer].

## 2020-08-24 ENCOUNTER — Other Ambulatory Visit: Payer: Self-pay | Admitting: Neurology

## 2020-08-24 DIAGNOSIS — G459 Transient cerebral ischemic attack, unspecified: Secondary | ICD-10-CM

## 2020-08-24 DIAGNOSIS — R0683 Snoring: Secondary | ICD-10-CM

## 2020-08-24 DIAGNOSIS — G4733 Obstructive sleep apnea (adult) (pediatric): Secondary | ICD-10-CM

## 2020-08-24 DIAGNOSIS — F518 Other sleep disorders not due to a substance or known physiological condition: Secondary | ICD-10-CM

## 2020-08-24 DIAGNOSIS — G4719 Other hypersomnia: Secondary | ICD-10-CM

## 2020-08-25 ENCOUNTER — Telehealth: Payer: Self-pay | Admitting: Neurology

## 2020-08-25 DIAGNOSIS — R0683 Snoring: Secondary | ICD-10-CM

## 2020-08-25 DIAGNOSIS — F518 Other sleep disorders not due to a substance or known physiological condition: Secondary | ICD-10-CM

## 2020-08-25 DIAGNOSIS — G4719 Other hypersomnia: Secondary | ICD-10-CM

## 2020-08-25 DIAGNOSIS — R0602 Shortness of breath: Secondary | ICD-10-CM

## 2020-08-25 DIAGNOSIS — G4733 Obstructive sleep apnea (adult) (pediatric): Secondary | ICD-10-CM

## 2020-08-25 DIAGNOSIS — G459 Transient cerebral ischemic attack, unspecified: Secondary | ICD-10-CM

## 2020-08-25 NOTE — Telephone Encounter (Signed)
Called patient to discuss sleep study results. No answer at this time. LVM for the patient to call back.   

## 2020-08-25 NOTE — Telephone Encounter (Signed)
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  There was loud snoring noted. I was surprised not to find hypoxia in a patient with COPD.  RECOMMENDATION: attended sleep study for titration BiPAP or CPAP in light of the high risk of central apnea being present.   INTERPRETING PHYSICIAN: [name], MD

## 2020-08-25 NOTE — Telephone Encounter (Signed)
Pt returned call. I advised pt that Dr. Brett Fairy reviewed their sleep study results and found that pt has severe sleep apnea. Dr. Brett Fairy recommends that pt starts auto CPAP. I reviewed PAP compliance expectations with the pt. Pt is agreeable to starting a CPAP. I advised pt that an order will be sent to a DME, [facility] and [facility] ([facility]) will call the pt within about one week after they file with the pt's insurance. [facility]) will show the pt how to use the machine, fit for masks, and troubleshoot the CPAP if needed. A follow up appt was made for insurance purposes with Dr. Brett Fairy on 12/01/20 at 9:30 am. Pt verbalized understanding to arrive 15 minutes early and bring their CPAP. A letter with all of this information in it will be mailed to the pt as a reminder. I verified with the pt that the address we have on file is correct. Pt verbalized understanding of results. Pt had no questions at this time but was encouraged to call back if questions arise. I have sent the order to [facility]) and have received confirmation that they have received the order.

## 2020-08-25 NOTE — Addendum Note (Signed)
Addended by: Darleen Crocker on: 08/25/2020 08:54 AM   Modules accepted: Orders

## 2020-12-01 ENCOUNTER — Ambulatory Visit: Payer: Medicare Other | Admitting: Neurology

## 2020-12-01 ENCOUNTER — Encounter: Payer: Self-pay | Admitting: Neurology

## 2020-12-01 VITALS — BP 122/75 | HR 63 | Ht 70.0 in | Wt 234.0 lb

## 2020-12-01 DIAGNOSIS — R0689 Other abnormalities of breathing: Secondary | ICD-10-CM | POA: Diagnosis not present

## 2020-12-01 DIAGNOSIS — J449 Chronic obstructive pulmonary disease, unspecified: Secondary | ICD-10-CM | POA: Diagnosis not present

## 2020-12-01 DIAGNOSIS — Z789 Other specified health status: Secondary | ICD-10-CM | POA: Diagnosis not present

## 2020-12-01 DIAGNOSIS — G4737 Central sleep apnea in conditions classified elsewhere: Secondary | ICD-10-CM | POA: Insufficient documentation

## 2020-12-01 MED ORDER — ALPRAZOLAM 0.25 MG PO TABS: 0.2500 mg | ORAL_TABLET | Freq: Every evening | ORAL | 0 refills | Status: DC | PRN

## 2020-12-01 NOTE — Addendum Note (Signed)
Addended by: [name] on: 12/01/2020 10:48 AM   Modules accepted: Orders

## 2020-12-01 NOTE — Progress Notes (Signed)
CM sent to [facility] 

## 2020-12-01 NOTE — Addendum Note (Signed)
Addended by: [name] on: 12/01/2020 10:30 AM   Modules accepted: Orders

## 2020-12-01 NOTE — Progress Notes (Addendum)
SLEEP MEDICINE CLINIC    Provider:  [name], MD  Primary Care Physician:  Crist Infante, MD [place] [address]     Referring Provider: Crist Infante, [place],  [place] [address]          Chief Complaint according to patient   Patient presents with:     New Patient (Initial Visit)           HISTORY OF PRESENT ILLNESS:  Todd Cummings is a 80 y.o. year old White or Caucasian male patient seen here as a referral on 12/01/2020 from Dr Joylene Draft- a patient with cognitive dysfunction, atrophy.   RV : Mr. Todd Cummings. [place] underwent a home sleep test in June of this year.  The home sleep test confirmed the presence of severe apnea but the patient had so little apnea and REM sleep that all his apnea seems to be dominantly in non-REM sleep.  This is an unusual presentation and if apnea is dominantly present in non-REM sleep it usually indicates that it is central and not all obstructive in origin.  His son insurance company however did not allow the patient to have an in lab titration which could have differentiated to 2 apneas further.  Instead it promoted to put the patient on an auto titration CPAP device which she has used since but has trouble is tolerating.  His download from his current CPAP shows that it is set between 5 and 17 cmH2O the average user time is about 4 hours a day.  The 95th percentile pressure is 7.6 cmH2O airleak is 8.1 L/min which is actually mild.  It is hard for him because he is is prone sleep.  And no interface will allow him to keep his prone sleep habit without getting dislodged.    The residual apneas and hypopneas per hour are concerning 27.5 and also the machine indicates that these are obstructive in nature I have doubts.  This is a very high apnea-hypopnea index residual for patient who according to the data from the same machine only meets 7.6 cmH2O to treat his apnea.  Why of the obstructive apnea is still present if there  is so much more pressure available to overcome these.  The apnea is also peaked from earlier in October on into mid October the last for 5 days have actually been better but then he also barely made it to 4 hours.  He is sleeping probably better slightly propped up may be in a recliner we can discuss a wedge but my first step is to make sure that we are not dealing with an unidentified central apnea.  So for this reason I will order an attended sleep study split-night AHI 10 to allow #1 to find the best and comfortable mask and to see if CPAP is in any way beneficial to the patient or if he needs BiPAP or ASV.  As he has so many apneas still left he does feel that he is a little less sleepy he gets his best sleep once the CPAP has been removed.       Chief concern according to patient :  explained by his wife, [name].  She feels he has still snoring and poor sleep quality, he gained weight. He thinks he may have had a sleep study in the past but it was more than ten years ago. He does not remember the results. Says it was completed at [facility]. Never  used CPAP machine. Here for snoring.     Todd Cummings  has a past medical history of TIA - 2015, STROKE MD,  Arthritis, BPH (benign prostatic hypertrophy), Bronchospasm, Chronic sinusitis, DDD (degenerative disc disease), lumbar, Esophageal stricture, GERD (gastroesophageal reflux disease), Heart disease, HLD (hyperlipidemia), HTN (hypertension), Kidney stones, and Snoring.he has undergone a deviated septum repair.     Sleep relevant medical history: COPD, Nocturia 1-2, short sleeper syndrome, average 4-5 hours of sleep time, joint pain, GERD, deviated septum repair.   Family medical /sleep history: No other family member on CPAP with OSA, insomnia, sleep walkers.    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   and lives in a household with spouse,  with adult children, and grandchildren- granddaughter just  graduated HS. .  Tobacco use: until 15 years ago . ETOH use ; used to drink more often , now 2-4 drinks a week.  Caffeine intake .Regular exercise in form of walking.    Hobbies : had to give up golfing. Fishing.       Sleep habits are as follows: The patient's dinner time is between 4.15- 6.15 PM.  The patient goes to bed at 9.30 PM and continues to sleep for 3-5 hours, may be interrupted after 3 hours, wakes up and watches TV, separate bedrooms.  The preferred sleep position is prone, with the support of 1-2 pillows.  Dreams are reportedly rare.  4-5  AM is the usual rise time. The patient wakes up spontaneously.  He  reports not feeling refreshed or restored in AM, with symptoms such as dry mouth phlegm, and residual fatigue.  Naps were not taken usually, but he stared with weight gain- now frequently, lasting from 15-30 minutes and are more refreshing than nocturnal sleep.    Review of Systems: Out of a complete 14 system review, the patient complains of only the following symptoms, and all other reviewed systems are negative.:  Fatigue, sleepiness , snoring, fragmented sleep, short sleep.    How likely are you to doze in the following situations: 0 = not likely, 1 = slight chance, 2 = moderate chance, 3 = high chance   Sitting and Reading?2 Watching Television?3 Sitting inactive in a public place (theater or meeting)?2 As a passenger in a car for an hour without a break?0 Lying down in the afternoon when circumstances permit?3 Sitting and talking to someone?0 Sitting quietly after lunch without alcohol?2-3 In a car, while stopped for a few minutes in traffic?0   Total = 5 from  15/ 24 points   FSS endorsed at 37/ 63 points.   Lost some weight.  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   Right-handed.   Rare caffeine use.   Social Determinants of Health   Financial Resource Strain: Not on file  Food Insecurity: Not on file  Transportation Needs: Not on file  Physical Activity: Not on file  Stress: Not on file  Social Connections: Not on file    Family History  Adopted: Yes  Family history unknown: Yes    Past Medical History:  Diagnosis Date   Arthritis    BPH (benign prostatic hypertrophy)    Bronchospasm    Chronic sinusitis    DDD (degenerative disc disease), lumbar    Esophageal stricture    GERD (gastroesophageal reflux disease)    Heart disease    HLD (hyperlipidemia)    HTN (hypertension)    Kidney stones    Snoring     Past Surgical History:  Procedure Laterality Date   CATARACT EXTRACTION Bilateral    COLONOSCOPY     ESOPHAGEAL DILATION  10/02   ESOPHAGOGASTRODUODENOSCOPY (EGD) WITH PROPOFOL N/A 01/16/2015   Procedure: ESOPHAGOGASTRODUODENOSCOPY (EGD) WITH PROPOFOL;  Surgeon: Laurence Spates, MD;  Location: [facility];  Service: Endoscopy;  Laterality: N/A;   HEEL SPUR SURGERY  4/02   NASAL SINUS SURGERY     SAVORY DILATION N/A 01/16/2015   Procedure: SAVORY DILATION;  Surgeon: Laurence Spates, MD;  Location: [facility];  Service: Endoscopy;  Laterality: N/A;   TRANSURETHRAL RESECTION OF PROSTATE  6/02     Current Outpatient Medications on File Prior to Visit  Medication Sig Dispense Refill   acetaminophen (TYLENOL) 650 MG CR  tablet Take 650 mg by mouth daily as needed for pain.     albuterol (VENTOLIN HFA) 108 (90 Base) MCG/ACT inhaler Inhale 2 puffs into the lungs every 6 (six) hours as needed for wheezing or shortness of breath.     aspirin 81 MG tablet Take 81 mg by mouth at bedtime.     atorvastatin (LIPITOR) 80 MG tablet Take 80 mg by mouth every evening.     chlorthalidone (HYGROTON) 25 MG tablet Take 25 mg by mouth daily.     COVID-19 mRNA Vac-TriS, Pfizer, SUSP injection Inject into the muscle. 0.3 mL 0   finasteride (PROSCAR) 5 MG tablet Take 5 mg by mouth 2 (two) times a day.      fluticasone (FLONASE) 50 MCG/ACT nasal spray Place 2 sprays into both nostrils daily as needed for allergies.      loratadine (CLARITIN) 10 MG tablet Take 10 mg by mouth daily.     losartan (COZAAR) 100 MG tablet Take 100 mg by mouth at bedtime.     metFORMIN (GLUCOPHAGE-XR) 500 MG 24 hr tablet Take 1,000 mg by mouth 2 (two) times daily.     naproxen sodium (ANAPROX) 220 MG tablet Take 220 mg by mouth 2 (two) times daily with a meal.     niacin (NIASPAN) 1000 MG CR tablet Take 1,000 mg by mouth at bedtime.     Omega-3 Fatty Acids (FISH OIL) 1000 MG CAPS Take 2,000 mg by mouth daily.      omeprazole (PRILOSEC OTC) 20 MG tablet Take 20 mg by mouth daily.     vitamin C (ASCORBIC ACID) 500 MG tablet Take 500 mg by mouth daily.     Zinc 30 MG TABS Take 1 tablet by mouth daily.     donepezil (ARICEPT) 10 MG tablet Take 10  mg by mouth at bedtime.     No current facility-administered medications on file prior to visit.    No Known Allergies  Physical exam:  Today's Vitals   12/01/20 0930  BP: 122/75  Pulse: 63  Weight: 234 lb (106.1 kg)  Height: 5\' 10"  (1.778 m)   Body mass index is 33.58 kg/m.   Wt Readings from Last 3 Encounters:  12/01/20 234 lb (106.1 kg)  07/14/20 238 lb (108 kg)  03/06/19 236 lb (107 kg)     Ht Readings from Last 3 Encounters:  12/01/20 5\' 10"  (1.778 m)  07/14/20 5\' 10"  (1.778 m)  03/06/19  5\' 10"  (1.778 m)      General: The patient is awake, alert and appears not in acute distress. The patient is well groomed. Head: Normocephalic, atraumatic. Neck is supple. Mallampati  3  neck circumference:19.5  Inches. Sinus pressure reported,  Nasal airflow patent- after surgery.  Retrognathia is not seen.  Dental status:  Cardiovascular:  Regular rate and cardiac rhythm by pulse,  without distended neck veins. Respiratory: Lungs are clear to auscultation.  Skin:  Without evidence of ankle edema, or rash. Trunk: The patient's posture is erect.   Neurologic exam : The patient is awake and alert, oriented to place and time.   Memory subjective described as intact.  Attention span & concentration ability appears normal.  Speech is fluent,  without  dysarthria, dysphonia or aphasia.  Mood and affect are appropriate.   Cranial nerves: no loss of smell or taste reported  Pupils are equal and briskly reactive to light. Funduscopic exam deferred. .  Extraocular movements in vertical and horizontal planes were intact and without nystagmus. No Diplopia. Visual fields by finger perimetry are intact. Hearing was intact to soft voice and finger rubbing.   Facial sensation intact to fine touch.  Facial motor strength is symmetric and tongue  moved midline.  Neck ROM : rotation, tilt and flexion extension were normal for age and shoulder shrug was symmetrical.    Motor exam:  Symmetric bulk, tone and ROM.   Normal tone without cog- wheeling, symmetric grip strength .   Sensory:  Fine touch and vibration were decreased in al toes, not ankles.  Proprioception tested in the upper extremities was normal.   Coordination: Rapid alternating movements in the fingers/hands were of normal speed.  The Finger-to-nose maneuver was intact without evidence of ataxia, dysmetria or tremor.   Gait and station: Patient could rise unassisted from a seated position, walked without assistive device.  Stance is  of normal width/ base and the patient turned with 3 steps.  Toe and heel walk were deferred.  Deep tendon reflexes: in the upper and lower extremities are symmetric and intact.  Babinski response was deferred.      Snoring and high grade airway narrowing , obesity and neck size-.high risk for OSA. Had several repeat pneumonia in 2020, non- Covid, COPD.  short sleeper habitually all his life, prone sleeper.  EDS excessive daytime sleepiness reported by wife at [place] 15 , him at 14.    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 We need an attended sleep study.  Unresponsive to CPAP - high residual AHI.  2) prone sleeper and nasal congestion.    My Plan is to proceed with:  1) attended SPLIT test 2) Melatonin 5 did not work 3) trazodone 50 mg.  Short sleep syndrome.  He was adopted, no record of family health.   I would like to thank Crist Infante, MD [address] [place],  [place] [address] for allowing me to meet with and to take care of this pleasant patient.    I plan to follow up either personally or through our NP within 2-4  month.   CC: I will share my notes with PCP.   Electronically signed by: [name], MD 12/01/2020 9:59 AM  [facility] Board certified by The [employer] of Sleep Medicine and Diplomate of the [employer] of Sleep Medicine. Board certified In Neurology through the [place], Fellow of the [employer] of Neurology. Medical Director of [facility].

## 2020-12-01 NOTE — Patient Instructions (Signed)

## 2020-12-10 ENCOUNTER — Telehealth: Payer: Self-pay

## 2020-12-10 NOTE — Telephone Encounter (Signed)
LVM for pt to call me back to schedule sleep study  

## 2021-01-19 ENCOUNTER — Other Ambulatory Visit: Payer: Self-pay

## 2021-01-19 ENCOUNTER — Ambulatory Visit (INDEPENDENT_AMBULATORY_CARE_PROVIDER_SITE_OTHER): Payer: Medicare Other | Admitting: Neurology

## 2021-01-19 DIAGNOSIS — Z789 Other specified health status: Secondary | ICD-10-CM

## 2021-01-19 DIAGNOSIS — G4731 Primary central sleep apnea: Secondary | ICD-10-CM | POA: Diagnosis not present

## 2021-01-19 DIAGNOSIS — J449 Chronic obstructive pulmonary disease, unspecified: Secondary | ICD-10-CM

## 2021-01-19 DIAGNOSIS — R0689 Other abnormalities of breathing: Secondary | ICD-10-CM

## 2021-01-19 DIAGNOSIS — G4737 Central sleep apnea in conditions classified elsewhere: Secondary | ICD-10-CM

## 2021-01-25 ENCOUNTER — Telehealth: Payer: Self-pay | Admitting: Neurology

## 2021-01-25 NOTE — Progress Notes (Signed)
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 He needs to be fitted for a mask in a one- on- one session.  1. Central Sleep Apnea (CSA) Cheyne stokes respiration while supine.  2. Snoring some while supine., 3. Central Sleep Apnea was NREM sleep dominant, none seen in REM sleep.   RECOMMENDATIONS:  1. Advise full-night, attended, BiPAP, ST or ASV titration study to optimize therapy.  If his insurance only allows auto BIPAP, will start with 4 cm spread from 10/6 through 16/12 cm water. I certify that I have reviewed the entire raw data recording prior to the issuance of this report in accordance with the Standards of Accreditation of the [place] [employer] of Sleep Medicine (AASM)

## 2021-01-25 NOTE — Telephone Encounter (Signed)
Called patient to discuss sleep study results. No answer at this time. LVM for the patient to call back.   

## 2021-01-25 NOTE — Addendum Note (Signed)
Addended by: [name] on: 01/25/2021 01:23 PM   Modules accepted: Orders

## 2021-01-25 NOTE — Telephone Encounter (Signed)
VM left and mychart msg sent asking pt to cb to r/s 1/31 appt- MD out.

## 2021-01-25 NOTE — Procedures (Signed)
PATIENT'S NAME:  Todd Cummings, Todd Cummings DOB:      02/05/1941      MR#:    [id number]     DATE OF RECORDING: 01/19/2021 Nona Dell REFERRING M.D.:  Crist Infante, MD Study Performed:   Baseline Polysomnogram HISTORY:  Todd Cummings is a 80 year old male patient who was on 12-01-2020  in a RV , following a  referral from Dr Joylene Draft- He follows up after a HST confirmed apnea to be present - results were :  Todd Cummings. Carne underwent a home sleep test in June 2022.  The home sleep test confirmed the presence of severe apnea but the patient had so little apnea in REM sleep that all his apnea seems to be dominantly in non-REM sleep.  This is an unusual presentation and if apnea is dominantly present in non-REM sleep it usually indicates that it is central apnea and not obstructive in origin.  His insurance company however did not allow the patient to have an in lab titration which could have differentiated to 2 apneas further. Instead it promoted to put the patient on an auto-titration CPAP device which she has used since but has trouble is tolerating.   His download from his current CPAP shows that it is set between 5 and 17 cmH2O the average user time is about 4 hours a day.  The 95th percentile pressure is 7.6 cmH2O air leak is 8.1 L/min which is actually mild.  It is hard for him because he is used to prone sleep- no interface will allow him to keep this prone sleep habit.     The residual apneas and hypopneas per hour are concerning 27.5/h  ABIMAEL Cummings has a medical history of TIA -( 2015, STROKE MD),  Arthritis, BPH (benign prostatic hypertrophy), Bronchospasm, Chronic sinusitis, DDD (degenerative disc disease), lumbar, Esophageal stricture due to GERD (gastroesophageal reflux disease), Heart disease, HLD (hyperlipidemia), HTN (hypertension), Kidney stones, and  EDS and Snoring- he has undergone a deviated septum repair.   The patient endorsed the Epworth Sleepiness Scale at 5 points.   The patient's weight 234  pounds with a height of 70 (inches), resulting in a BMI of 33.5 kg/m2. The patient's neck circumference measured 19.5 inches.  CURRENT MEDICATIONS: Tylenol, Ventolin HFA, Aspirin, Lipitor, Hygroton, Proscar, Flonase, Claritin, Cozaar, Glucophage-XR, Niaspan, Fish oil, Prilosec OTC, Ascorbic acid, Zinc, Aricept   PROCEDURE:  This is a multichannel digital polysomnogram utilizing the Somnostar 11.2 system.  Electrodes and sensors were applied and monitored per AASM Specifications.   EEG, EOG, Chin and Limb EMG, were sampled at 200 Hz.  ECG, Snore and Nasal Pressure, Thermal Airflow, Respiratory Effort, CPAP Flow and Pressure, Oximetry was sampled at 50 Hz. Digital video and audio were recorded.      BASELINE STUDY: Lights Out was at 20:59 and Lights On at 05:02.  Total recording time (TRT) was 483 minutes, with a total sleep time (TST) of 372 minutes.   The patient's sleep latency was 20.5 minutes.  REM latency was 53 minutes.  The sleep efficiency was 77.2 %.     SLEEP ARCHITECTURE: WASO (Wake after sleep onset) was 39 minutes.  There were 59.5 minutes in Stage N1, 213 minutes Stage N2, 13 minutes Stage N3 and 86.5 minutes in Stage REM.  The percentage of Stage N1 was 16.%, Stage N2 was 57.3%, Stage N3 was 3.5% and Stage R (REM sleep) was 23.3%.  The arousals were noted as: 109 were spontaneous, 0 were  associated with PLMs, 61 were associated with respiratory events.  RESPIRATORY ANALYSIS:  There were a total of 66 respiratory events:  1 obstructive apneas, 58 central apneas and 0 mixed apneas with a total of 59 apneas and an apnea index (AI) of 9.5 /hour. There were 7 hypopneas with a hypopnea index of 1.1 /hour. The patient also had 0 respiratory event related arousals (RERAs).     The total APNEA/HYPOPNEA INDEX (AHI) was 10.6/hour -0 events occurred in REM sleep and 72 events in NREM. The REM AHI was  0 /hour, versus a non-REM AHI of 13.9/h. The patient spent 35.5 minutes of total sleep time in the  supine position and 337 minutes in non-supine. The supine AHI was 79.4/h versus a non-supine AHI of 3.3.  OXYGEN SATURATION & C02:  The Wake baseline 02 saturation was 96%, with the lowest being 84%. Time spent below 89% saturation equaled 17 minutes. The arousals were noted as: 109 were spontaneous, 0 were associated with PLMs, 61 were associated with respiratory events.   PERIODIC LIMB MOVEMENTS:   The patient had a total of 0 Periodic Limb Movements  Audio and video analysis did not show any abnormal or unusual movements, behaviors, phonations or vocalizations.   EKG was in regular rhythm.    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 He needs to be fitted for a mask in a one- on- one session.  Central Sleep Apnea (CSA) Cheyne stokes respiration while supine.  Snoring some while supine., Central Sleep Apnea was NREM sleep dominant, none seen in REM sleep.   RECOMMENDATIONS:  Advise full-night, attended, BiPAP, ST or ASV titration study to optimize therapy.  If his insurance only allows auto BIPAP, will start with 4 cm spread from 10/6 through 16/12 cm water. I certify that I have reviewed the entire raw data recording prior to the issuance of this report in accordance with the Standards of Accreditation of the American Academy of Sleep Medicine (AASM)   [name], MD Diplomat, American Board of Neurology  Diplomat, American Board of Sleep Medicine Market researcher, [employer] [facility] [employer]

## 2021-01-25 NOTE — Telephone Encounter (Signed)
I called pt. I advised pt that Dr. Brett Fairy reviewed their sleep study results and found that has central sleep apnea and recommends that pt be treated with a cpap/bipap. Dr. Brett Fairy recommends that pt return for a repeat sleep study in order to properly titrate the cpap to bipap and ensure a good mask fit. Pt is agreeable to returning for a titration study. I advised pt that our sleep lab will file with pt's insurance and call pt to schedule the sleep study when we hear back from the pt's insurance regarding coverage of this sleep study. Pt verbalized understanding of results. Pt had no questions at this time but was encouraged to call back if questions arise.

## 2021-01-25 NOTE — Telephone Encounter (Signed)
Pt is asking for a call back from Casey, RN 

## 2021-01-25 NOTE — Telephone Encounter (Signed)
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 He needs to be fitted for a mask in a one- on- one session.  1. Central Sleep Apnea (CSA) Cheyne stokes respiration while supine.  2. Snoring some while supine., 3. Central Sleep Apnea was NREM sleep dominant, none seen in REM sleep.   RECOMMENDATIONS:  1. Advise full-night, attended, BiPAP, ST or ASV titration study to optimize therapy.  If his insurance only allows auto BIPAP, will start with 4 cm spread from 10/6 through 16/12 cm water. I certify that I have reviewed the entire raw data recording prior to the issuance of this report in accordance with the Standards of Accreditation of the [place] [employer] of Sleep Medicine (AASM)

## 2021-02-28 ENCOUNTER — Ambulatory Visit (INDEPENDENT_AMBULATORY_CARE_PROVIDER_SITE_OTHER): Payer: Medicare Other | Admitting: Neurology

## 2021-02-28 ENCOUNTER — Other Ambulatory Visit: Payer: Self-pay

## 2021-02-28 DIAGNOSIS — G4739 Other sleep apnea: Secondary | ICD-10-CM

## 2021-02-28 DIAGNOSIS — G4731 Primary central sleep apnea: Secondary | ICD-10-CM | POA: Diagnosis not present

## 2021-02-28 DIAGNOSIS — Z789 Other specified health status: Secondary | ICD-10-CM

## 2021-02-28 DIAGNOSIS — G4737 Central sleep apnea in conditions classified elsewhere: Secondary | ICD-10-CM

## 2021-02-28 DIAGNOSIS — G4719 Other hypersomnia: Secondary | ICD-10-CM

## 2021-02-28 DIAGNOSIS — G459 Transient cerebral ischemic attack, unspecified: Secondary | ICD-10-CM

## 2021-02-28 DIAGNOSIS — J449 Chronic obstructive pulmonary disease, unspecified: Secondary | ICD-10-CM

## 2021-03-03 NOTE — Progress Notes (Signed)
The patient was titrated to low CPAP - and did extraordinarily well !  One  central apnea emerged under treatment.  DIAGNOSIS No evidence of persistent Sleep Apnea while in non-supine sleep- under 7 cm water pressure CPAP. No snoring.  No Sleep Related Hypoxemia No Periodic Limb Movement Disorder  Slow EKG  PLANS/RECOMMENDATIONS: This is a most unusual outcome of a CPAP titration study, the patient did excellent under 7 cm water CPAP, should be using a small mask to allow side or prone sleep.  He was fitted with a small Evora FFM here in the lab, did well.    DISCUSSION: The CPAP machine will be set to 7-8 cm water pressure, no EPR and mask of patient's choice. This may be an EVORA in small size or similar. The patient will need to avoid supine sleep

## 2021-03-03 NOTE — Procedures (Signed)
PATIENT'S NAME:  Todd Cummings, Todd Cummings  DOB:                    12/06/1940      MR#:    [id number]     DATE OF RECORDING: 02/28/2021 REFERRING M.D.:  Crist Infante MD/ Antony Contras, MD  Study Performed:   CPAP  Titration  HISTORY:  Todd Cummings is a 81 year old male patient who was last seen on 12-01-2020  in a RV , following a  referral from Dr Joylene Draft- He follows up after a HST confirmed apnea to be present  Todd Cummings underwent a home sleep test in June 2022.  The home sleep test confirmed the presence of severe apnea, but the patient had so little apnea in REM sleep that all his apnea seems to be dominantly in non-REM sleep.  This is an unusual presentation and if apnea is dominantly present in non-REM sleep it usually indicates that it is central apnea and not obstructive in origin.   His insurance company however did not allow the patient to have an in-lab titration which could have differentiated to 2 apneas further. Instead, it promoted the auto-titration CPAP device which he has used since but has trouble tolerating. He can't sleep supine and is a prone sleeper. It is hard for him because he is used to prone sleep- no interface will allow him to keep this prone sleep habit.   The residual apneas and hypopneas per hour are concerning 27.5/h   His current auto-CPAP shows that it is set between 5 and 17 cmH2O, the average user time is about 4 hours a day. The 95th percentile pressure is 7.6 cmH2O air leak is 8.1 L/min which is actually mild.     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   The patient endorsed the Epworth Sleepiness Scale at 5 points.   The patient's weight  234 pounds with a height of 70 (inches), resulting in a BMI of 33.5 kg/m2. The patient's neck circumference measured 19.5 inches.  CURRENT MEDICATIONS: Tylenol, Ventolin HFA, Aspirin, Lipitor, Hygroton, Proscar, Flonase, Claritin, Cozaar, Glucophage-XR, , Niaspan, Fish oil, Prilosec OTC, Ascorbic acid, Zinc, Aricept  CPAP was initiated at 7 cmH20 under an EVORA FFM in small-medium size, with heated humidity per AASM standards and pressure was advanced to 8 cmH20 . At a PAP pressure of 7 cmH20, there was a reduction of the AHI to 0 with improvement of sleep apnea over a 170 minute sleep time. There was frequent REM sleep noted.  Lights Out was at 20:58 and Lights On at 05:08. Total recording time (TRT) was 490.5 minutes, with a total sleep time (TST) of 360.5 minutes. The patient's sleep latency was 23.5 minutes. REM latency was 59 minutes.  The sleep efficiency was 73.5 %.    SLEEP ARCHITECTURE: WASO (Wake after sleep onset) was 111.5 minutes.  There were 29 minutes in Stage N1, 220.5 minutes Stage N2, 29.5 minutes Stage N3 and 81.5 minutes in Stage REM.  The percentage of Stage N1 was 8.%, Stage N2 was 61.2%, Stage N3 was 8.2% and Stage R (REM sleep) was 22.6%.   RESPIRATORY ANALYSIS:  There was a total of 1 respiratory event: 0  obstructive apneas, 1 central apnea and 0 mixed apneas with 0 hypopneas.      The total APNEA/HYPOPNEA INDEX  (AHI) was 0.2 /hour and the total RESPIRATORY DISTURBANCE INDEX was .2 /hour  1 event occurred in REM sleep and 0 events in NREM.  The REM AHI was .7 /hour versus a non-REM AHI of 0 /hour.   The patient spent 0.5 minutes of total sleep time in the supine position and 360 minutes in non-supine. The supine AHI was 0.0, versus a non-supine AHI of 0.2.  OXYGEN SATURATION & C02:  The baseline 02 saturation was 95%, with the lowest being 88%. Time spent below 89% saturation equaled 0 minutes.  PERIODIC LIMB MOVEMENTS:  The patient had a total of 0 Periodic Limb  Movements.  The arousals were noted as: 51 were spontaneous, 0 were associated with PLMs, 0 were associated with respiratory events. Audio and video analysis did not show any abnormal or unusual movements, behaviors, phonations or vocalizations. EKG was bradycardic, 48 -57 bpm.    DIAGNOSIS No evidence of persistent Sleep Apnea while in non-supine sleep- under 7 cm water pressure CPAP. No snoring.  No Sleep Related Hypoxemia No Periodic Limb Movement Disorder  Slow EKG  PLANS/RECOMMENDATIONS: This is a most unusual outcome of a CPAP titration study, the patient did excellent under 7 cm water CPAP, should be using a small mask to allow side or prone sleep.  He was fitted with a small Evora FFM here in the lab, did well.    DISCUSSION: The CPAP machine will be set to 7-8 cm water pressure, no EPR and mask of patient's choice. This may be an EVORA in small size or similar. The patient will need to avoid supine sleep   A follow up appointment will be scheduled in the Sleep Clinic at [facility].   Please call [phone] with any questions.     I certify that I have reviewed the entire raw data recording prior to the issuance of this report in accordance with the Standards of Accreditation of the American Academy of Sleep Medicine (AASM)  [name], M.D. Diplomat, Tax adviser of Neurology  Diplomat, Tax adviser of Sleep Medicine Market researcher, [employer] [facility] at [employer]

## 2021-03-03 NOTE — Addendum Note (Signed)
Addended by: [name] on: 03/03/2021 07:13 PM   Modules accepted: Orders

## 2021-03-04 ENCOUNTER — Encounter: Payer: Self-pay | Admitting: Neurology

## 2021-03-09 ENCOUNTER — Ambulatory Visit: Payer: Medicare Other | Admitting: Neurology

## 2021-03-24 ENCOUNTER — Ambulatory Visit: Payer: Medicare Other | Admitting: Neurology

## 2021-04-13 ENCOUNTER — Encounter: Payer: Self-pay | Admitting: Neurology

## 2021-04-13 ENCOUNTER — Ambulatory Visit: Payer: Medicare Other | Admitting: Neurology

## 2021-04-13 ENCOUNTER — Other Ambulatory Visit: Payer: Self-pay

## 2021-04-13 VITALS — BP 110/69 | HR 62 | Ht 70.0 in | Wt 240.0 lb

## 2021-04-13 DIAGNOSIS — J449 Chronic obstructive pulmonary disease, unspecified: Secondary | ICD-10-CM

## 2021-04-13 DIAGNOSIS — G4731 Primary central sleep apnea: Secondary | ICD-10-CM

## 2021-04-13 DIAGNOSIS — Z789 Other specified health status: Secondary | ICD-10-CM | POA: Diagnosis not present

## 2021-04-13 DIAGNOSIS — G9340 Encephalopathy, unspecified: Secondary | ICD-10-CM

## 2021-04-13 MED ORDER — TRAZODONE HCL 50 MG PO TABS: 50.0000 mg | ORAL_TABLET | Freq: Every evening | ORAL | 5 refills | Status: DC | PRN

## 2021-04-13 NOTE — Patient Instructions (Addendum)
Trazodone Tablets What is this medication? TRAZODONE (TRAZ oh done) treats depression. It increases the amount of serotonin in the brain, a hormone that helps regulate mood. This medicine may be used for other purposes; ask your health care provider or pharmacist if you have questions. COMMON BRAND NAME(S): Desyrel What should I tell my care team before I take this medication? They need to know if you have any of these conditions: Attempted suicide or thinking about it Bipolar disorder Bleeding problems Glaucoma Heart disease, or previous heart attack Irregular heart beat Kidney or liver disease Low levels of sodium in the blood An unusual or allergic reaction to trazodone, other medications, foods, dyes or preservatives Pregnant or trying to get pregnant Breast-feeding How should I use this medication? Take this medication by mouth with a glass of water. Follow the directions on the prescription label. Take this medication shortly after a meal or a light snack. Take your medication at regular intervals. Do not take your medication more often than directed. Do not stop taking this medication suddenly except upon the advice of your care team. Stopping this medication too quickly may cause serious side effects or your condition may worsen. A special MedGuide will be given to you by the pharmacist with each prescription and refill. Be sure to read this information carefully each time. Talk to your care team regarding the use of this medication in children. Special care may be needed. Overdosage: If you think you have taken too much of this medicine contact a poison control center or emergency room at once. NOTE: This medicine is only for you. Do not share this medicine with others. What if I miss a dose? If you miss a dose, take it as soon as you can. If it is almost time for your next dose, take only that dose. Do not take double or extra doses. What may interact with this medication? Do not  take this medication with any of the following: Certain medications for fungal infections like fluconazole, itraconazole, ketoconazole, posaconazole, voriconazole Cisapride Dronedarone Linezolid MAOIs like Carbex, Eldepryl, Marplan, Nardil, and Parnate Mesoridazine Methylene blue (injected into a vein) Pimozide Saquinavir Thioridazine This medication may also interact with the following: Alcohol Antiviral medications for HIV or AIDS Aspirin and aspirin-like medications Barbiturates like phenobarbital Certain medications for blood pressure, heart disease, irregular heart beat Certain medications for depression, anxiety, or psychotic disturbances Certain medications for migraine headache like almotriptan, eletriptan, frovatriptan, naratriptan, rizatriptan, sumatriptan, zolmitriptan Certain medications for seizures like carbamazepine and phenytoin Certain medications for sleep Certain medications that treat or prevent blood clots like dalteparin, enoxaparin, warfarin Digoxin Fentanyl Lithium NSAIDS, medications for pain and inflammation, like ibuprofen or naproxen Other medications that prolong the QT interval (cause an abnormal heart rhythm) like dofetilide Rasagiline Supplements like St. John's wort, kava kava, valerian Tramadol Tryptophan This list may not describe all possible interactions. Give your health care provider a list of all the medicines, herbs, non-prescription drugs, or dietary supplements you use. Also tell them if you smoke, drink alcohol, or use illegal drugs. Some items may interact with your medicine. What should I watch for while using this medication? Tell your care team if your symptoms do not get better or if they get worse. Visit your care team for regular checks on your progress. Because it may take several weeks to see the full effects of this medication, it is important to continue your treatment as prescribed by your care team. Watch for new or worsening  thoughts of   depression. This includes sudden changes in mood, behaviors, or thoughts. These changes can happen at any time but are more common in the beginning of treatment or after a change in dose. Call your care team right away if you experience these thoughts or worsening depression. ?Manic episodes may happen in patients with bipolar disorder who take this medication. Watch for changes in feelings or behaviors such as feeling anxious, nervous, agitated, panicky, irritable, hostile, aggressive, impulsive, severely restless, overly excited and hyperactive, or trouble sleeping. These changes can happen at any time but are more common in the beginning of treatment or after a change in dose. Call your care team right away if you notice any of these symptoms. ?You may get drowsy or dizzy. Do not drive, use machinery, or do anything that needs mental alertness until you know how this medication affects you. Do not stand or sit up quickly, especially if you are an older patient. This reduces the risk of dizzy or fainting spells. Alcohol may interfere with the effect of this medication. Avoid alcoholic drinks. ?This medication may cause dry eyes and blurred vision. If you wear contact lenses you may feel some discomfort. Lubricating drops may help. See your eye doctor if the problem does not go away or is severe. ?Your mouth may get dry. Chewing sugarless gum, sucking hard candy and drinking plenty of water may help. Contact your care team if the problem does not go away or is severe. ?What side effects may I notice from receiving this medication? ?Side effects that you should report to your care team as soon as possible: ?Allergic reactions--skin rash, itching, hives, swelling of the face, lips, tongue, or throat ?Bleeding--bloody or black, tar-like stools, red or dark [place] urine, vomiting blood or Zmuda material that looks like coffee grounds, small, red or purple spots on skin, unusual bleeding or bruising ?Heart  rhythm changes--fast or irregular heartbeat, dizziness, feeling faint or lightheaded, chest pain, trouble breathing ?Low blood pressure--dizziness, feeling faint or lightheaded, blurry vision ?Low sodium level--muscle weakness, fatigue, dizziness, headache, confusion ?Prolonged or painful erection ?Serotonin syndrome--irritability, confusion, fast or irregular heartbeat, muscle stiffness, twitching muscles, sweating, high fever, seizures, chills, vomiting, diarrhea ?Sudden eye pain or change in vision such as blurry vision, seeing halos around lights, vision loss ?Thoughts of suicide or self-harm, worsening mood, feelings of depression ?Side effects that usually do not require medical attention (report to your care team if they continue or are bothersome): ?Change in sex drive or performance ?Constipation ?Dizziness ?Drowsiness ?This list may not describe all possible side effects. Call your doctor for medical advice about side effects. You may report side effects to FDA at 1-800-FDA-1088. ?Where should I keep my medication? ?Keep out of the reach of children and pets. ?Store at room temperature between 15 and 30 degrees C (59 to 86 degrees F). Protect from light. Keep container tightly closed. Throw away any unused medication after the expiration date. ?NOTE: This sheet is a summary. It may not cover all possible information. If you have questions about this medicine, talk to your doctor, pharmacist, or health care provider. ?? 2022 Elsevier/Gold Standard (2020-01-15 00:00:00) ? ?

## 2021-04-13 NOTE — Progress Notes (Signed)
SLEEP MEDICINE CLINIC    Provider:  [name], MD  Primary Care Physician:  Crist Infante, MD [place] [address]     Referring Provider: Crist Infante, [address] [place],  [place] [address]          Chief Complaint according to patient   Patient presents with:     New Patient (Initial Visit)      Crist Infante MD/ Antony Contras, MD       HISTORY OF PRESENT ILLNESS:  Todd Cummings is a 81 y.o. year old White or Caucasian male patient seen here as a referral on 04/13/2021 from Dr Joylene Draft- a patient with cognitive dysfunction, and sleep disturbance:    RV : [name]. Bewley 04-13-2021: returns with his RESVENT machine after sleep study on 02-28-2021-   " No evidence of persistent Sleep Apnea while in non-supine sleep- under 7 cm water pressure CPAP. No snoring.   PLANS/RECOMMENDATIONS: This is a most unusual outcome of a CPAP titration study, the patient did excellent under 7 cm water CPAP, should be using a small mask to allow side or prone sleep.  He was fitted with a small Evora FFM here in the lab, did well.    DISCUSSION: The CPAP machine will be set to 7-8 cm water pressure, no EPR and mask of patient's choice. This may be an EVORA in small size or similar. The patient will need to avoid supine sleep."   [employer] - gave him a small N 30 I with nasal cradle.  This is comfortable.  The overall AHI is now between 12 and 13 which is half of what it used to be when we initially met.  He has been a 90% user by daytime that is 27 out of 30 days and he used the machine 16 of those days over 4 hours consecutively.  He has also added some use at nighttime.  The average hour for days used is 4.5 hours so he fulfills the compliance criteria.  He was set up 7 months 7 days ago I wish the current and date instead of time.  His device is on iron breeze 28 with a serial number [id number] to be [id number] and his machine was set at 8 cm water.  So his 95th percentile is 7.2 cmH2O  he does not use EPR.  He has 9.4 apneas per hour residual and these are all obstructive and not central.  So based on his air leak at the 95th percentile is 25.2 L a minute that is a peak value I think there are some erroneous counts of apneas that are not truly apneas included in the residual.  He is doing better-  with the sleep position.  With the mask. He lost some weight, he used 0.25 mg xanax for a week and that helped , but didn't last.- insomnia needs to be addressed.  He is going to sleep within 15 minutes, but he naps a lot- and a long time, in front of the Tv. We discussed that change- reduce naps and if you nap, cut them short. .  Bedtime 8.30-9.30 and waking up 4.30 AM. He felt more refreshed with the new mask and machine.    He underwent a home sleep test in June 22.   The home sleep test confirmed the presence of severe apnea but the patient had so little apnea and REM sleep that all his apnea seems to be dominantly  in non-REM sleep.  This is an unusual presentation and if apnea is dominantly present in non-REM sleep it usually indicates that it is central and not all obstructive in origin.  His health insurance company however did not allow the patient to have an in lab titration which could have differentiated to 2 apneas further.  Instead it promoted to put the patient on an auto titration CPAP device which she has used since but has trouble is tolerating.  His download from his current CPAP shows that it is set between 5 and 17 cmH2O the average user time is about 4 hours a day.  The 95th percentile pressure is 7.6 cmH2O airleak is 8.1 L/min which is actually mild.  It is hard for him because he is is prone sleep.  And no interface will allow him to keep his prone sleep habit without getting dislodged.    The residual apneas and hypopneas per hour are concerning 27.5 and also the machine indicates that these are obstructive in nature I have doubts.  This is a very high apnea-hypopnea  index residual for patient who according to the data from the same machine only meets 7.6 cmH2O to treat his apnea.  Why of the obstructive apnea is still present if there is so much more pressure available to overcome these.  The apnea is also peaked from earlier in October on into mid October the last for 5 days have actually been better but then he also barely made it to 4 hours.  He is sleeping probably better slightly propped up may be in a recliner we can discuss a wedge but my first step is to make sure that we are not dealing with an unidentified central apnea.  So for this reason I will order an attended sleep study split-night AHI 10 to allow #1 to find the best and comfortable mask and to see if CPAP is in any way beneficial to the patient or if he needs BiPAP or ASV.   As he has so many apneas still left he does feel that he is a little less sleepy he gets his best sleep once the CPAP has been removed.      Chief concern according to patient :  explained by his wife, [name].  She feels he has still snoring and poor sleep quality, he gained weight. He thinks he may have had a sleep study in the past but it was more than ten years ago. He does not remember the results. Says it was completed at [facility]. Never used CPAP machine. Here for snoring.     Todd Cummings  has a past medical history of TIA - 2015, STROKE MD,  Arthritis, BPH (benign prostatic hypertrophy), Bronchospasm, Chronic sinusitis, DDD (degenerative disc disease), lumbar, Esophageal stricture, GERD (gastroesophageal reflux disease), Heart disease, HLD (hyperlipidemia), HTN (hypertension), Kidney stones, and Snoring.he has undergone a deviated septum repair.     Sleep relevant medical history: COPD, Nocturia 1-2, short sleeper syndrome, average 4-5 hours of sleep time, joint pain, GERD, deviated septum repair.   Family medical /sleep history: No other family member on CPAP with OSA, insomnia, sleep walkers.    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   and lives in a household with spouse,  with adult children, and grandchildren- granddaughter just graduated HS. .  Tobacco use: until 15 years ago . ETOH use ; used to drink more often ,  now 2-4 drinks a week.  Caffeine intake .Regular exercise in form of walking.    Hobbies : had to give up golfing. Fishing.       Sleep habits are as follows: The patient's dinner time is between 4.15- 6.15 PM.  The patient goes to bed at 9.30 PM and continues to sleep for 3-5 hours, may be interrupted after 3 hours, wakes up and watches TV, separate bedrooms.  The preferred sleep position is prone, with the support of 1-2 pillows.  Dreams are reportedly rare.  4-5  AM is the usual rise time. The patient wakes up spontaneously.  He  reports not feeling refreshed or restored in AM, with symptoms such as dry mouth phlegm, and residual fatigue.  Naps were not taken usually, but he stared with weight gain- now frequently, lasting from 15-30 minutes and are more refreshing than nocturnal sleep.    Review of Systems: Out of a complete 14 system review, the patient complains of only the following symptoms, and all other reviewed systems are negative.:  Fatigue, sleepiness , snoring, fragmented sleep, short sleep.    How likely are you to doze in the following situations: 0 = not likely, 1 = slight chance, 2 = moderate chance, 3 = high chance   Sitting and Reading?2 Watching Television?3 Sitting inactive in a public place (theater or meeting)?2 As a passenger in a car for an hour without a break?0 Lying down in the afternoon when circumstances permit?3 Sitting and talking to someone?0 Sitting quietly after lunch without alcohol?2-3 In a car, while stopped for a few minutes in traffic?0   Total = 5 from  15/ 24 points   FSS endorsed at 37/ 63 points.   Lost some weight.  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   Right-handed.   Rare caffeine use.   Social Determinants of Health   Financial Resource Strain: Not on file  Food Insecurity: Not on file  Transportation Needs: Not on file  Physical Activity: Not on file  Stress: Not on file  Social Connections: Not on file    Family History  Adopted: Yes  Family history unknown: Yes    Past Medical History:  Diagnosis Date   Arthritis    BPH (benign prostatic hypertrophy)    Bronchospasm    Chronic sinusitis    DDD (degenerative disc disease), lumbar    Esophageal stricture    GERD (gastroesophageal reflux disease)    Heart disease    HLD (hyperlipidemia)    HTN (hypertension)    Kidney stones    Snoring     Past  Surgical History:  Procedure Laterality Date   CATARACT EXTRACTION Bilateral    COLONOSCOPY     ESOPHAGEAL DILATION  10/02   ESOPHAGOGASTRODUODENOSCOPY (EGD) WITH PROPOFOL N/A 01/16/2015   Procedure: ESOPHAGOGASTRODUODENOSCOPY (EGD) WITH PROPOFOL;  Surgeon: Carman Ching, MD;  Location: [facility] ENDOSCOPY;  Service: Endoscopy;  Laterality: N/A;   HEEL SPUR SURGERY  4/02   NASAL SINUS SURGERY     SAVORY DILATION N/A 01/16/2015   Procedure: SAVORY DILATION;  Surgeon: Carman Ching, MD;  Location: [facility] ENDOSCOPY;  Service: Endoscopy;  Laterality: N/A;    TRANSURETHRAL RESECTION OF PROSTATE  6/02     Current Outpatient Medications on File Prior to Visit  Medication Sig Dispense Refill   acetaminophen (TYLENOL) 650 MG CR tablet Take 650 mg by mouth daily as needed for pain.     albuterol (VENTOLIN HFA) 108 (90 Base) MCG/ACT inhaler Inhale 2 puffs into the lungs every 6 (six) hours as needed for wheezing or shortness of breath.     ALPRAZolam (XANAX) 0.25 MG tablet Take 1 tablet (0.25 mg total) by mouth at bedtime as needed for anxiety. 10 tablet 0   aspirin 81 MG tablet Take 81 mg by mouth at bedtime.     atorvastatin (LIPITOR) 80 MG tablet Take 80 mg by mouth every evening.     chlorthalidone (HYGROTON) 25 MG tablet Take 25 mg by mouth daily.     donepezil (ARICEPT) 10 MG tablet Take 10 mg by mouth at bedtime.     finasteride (PROSCAR) 5 MG tablet Take 5 mg by mouth 2 (two) times a day.      fluticasone (FLONASE) 50 MCG/ACT nasal spray Place 2 sprays into both nostrils daily as needed for allergies.      losartan (COZAAR) 100 MG tablet Take 100 mg by mouth at bedtime.     metFORMIN (GLUCOPHAGE-XR) 500 MG 24 hr tablet Take 1 tablet by mouth 2 (two) times daily.     naproxen sodium (ANAPROX) 220 MG tablet Take 220 mg by mouth 2 (two) times daily with a meal.     niacin (NIASPAN) 1000 MG CR tablet Take 1,000 mg by mouth at bedtime.     Omega-3 Fatty Acids (FISH OIL) 1000 MG CAPS Take 2,000 mg by mouth daily.      omeprazole (PRILOSEC OTC) 20 MG tablet Take 20 mg by mouth daily.     vitamin C (ASCORBIC ACID) 500 MG tablet Take 500 mg by mouth daily.     Zinc 30 MG TABS Take 1 tablet by mouth daily.     No current facility-administered medications on file prior to visit.    No Known Allergies  Physical exam:  Today's Vitals   04/13/21 0942  BP: 110/69  Pulse: 62  Weight: 240 lb (108.9 kg)  Height: 5\' 10"  (1.778 m)   Body mass index is 34.44 kg/m.   Wt Readings from Last 3 Encounters:  04/13/21 240 lb (108.9 kg)  12/01/20 234 lb  (106.1 kg)  07/14/20 238 lb (108 kg)     Ht Readings from Last 3 Encounters:  04/13/21 5\' 10"  (1.778 m)  12/01/20 5\' 10"  (1.778 m)  07/14/20 5\' 10"  (1.778 m)      General: The patient is awake, alert and appears not in acute distress. The patient is well groomed. Head: Normocephalic, atraumatic. Neck is supple. Mallampati  3  neck circumference:19.5  Inches. Sinus pressure reported,  Nasal airflow patent- after surgery.  Retrognathia is not seen.  Dental status:  Cardiovascular:  Regular rate and cardiac rhythm by pulse,  without distended neck veins. Respiratory: Lungs are clear to auscultation.  Skin:  Without evidence of ankle edema, or rash. Trunk: The patient's posture is erect.   Neurologic exam : The patient is awake and alert, oriented to place and time.   Memory subjective described as intact.  Attention span & concentration ability appears normal.  Speech is fluent,  without  dysarthria, dysphonia or aphasia.  Mood and affect are appropriate.   Cranial nerves: no loss of smell or taste reported  Pupils are equal and briskly reactive to light. Funduscopic exam deferred. .  Extraocular movements in vertical and horizontal planes were intact and without nystagmus. No Diplopia. Visual fields by finger perimetry are intact. Hearing was intact to soft voice and finger rubbing.   Facial sensation intact to fine touch.  Facial motor strength is symmetric and tongue  moved midline.  Neck ROM : rotation, tilt and flexion extension were normal for age and shoulder shrug was symmetrical.    Motor exam:  Symmetric bulk, tone and ROM.   Normal tone without cog- wheeling, symmetric grip strength .   Sensory:  Fine touch and vibration were decreased in al toes, not ankles.  Proprioception tested in the upper extremities was normal.   Coordination: Rapid alternating movements in the fingers/hands were of normal speed.  The Finger-to-nose maneuver was intact without evidence of  ataxia, dysmetria or tremor.   Gait and station: Patient could rise unassisted from a seated position, walked without assistive device.  Stance is of normal width/ base and the patient turned with 3 steps.  Toe and heel walk were deferred.  Deep tendon reflexes: in the upper and lower extremities are symmetric and intact.  Babinski response was deferred.      Snoring and high grade airway narrowing , obesity and neck size-.high risk for OSA. Had several repeat pneumonia in 2020, non- Covid, COPD.  short sleeper habitually all his life, prone sleeper.  EDS excessive daytime sleepiness reported by wife at [facility] 10 now-  CPAP use and on Aricept.     After spending a total time of minutes face to face and additional time for physical and neurologic examination, review of laboratory studies,  personal review of imaging studies, reports and results of other testing and review of referral information / records as far as provided in visit, I have established the following assessments:  1) insomnia.- on PAP. 8 cm I breeze machine. Trazodone ordered/  2) prone sleeper and nasal congestion, does better with CPAP.  Residual AHI is artificially high due to air leakage. Still better than baseline.    My Plan is to proceed with:  1) Sleep coach by [facility] couldn't find any data - chip card may mis-function.  He has been able to use the PAP machine.  I found enough data to state it is reliably followed.  N30 I mask-   2)Trazodone 50 mg.  Short sleep syndrome? Early sleep phase?  He was adopted, no record of family health.  Xanax 0.25 mg was helping a bit, too. But we discussed addiction risk.  I would like to thank Crist Infante, MD [address] [place],  [place] [address] for allowing me to meet with and to take care of this pleasant patient.    I plan to follow up either personally or through our NP within 6 months for [facility] or [facility] follow up.    CC: I will share my  notes with PCP.    Electronically signed by: [name], MD 04/13/2021 9:44 AM  [facility] Board certified by The [employer] of Sleep Medicine and Diplomate of the [employer] of Sleep Medicine. Board certified In Neurology through the [employer], Fellow of the [employer] of Neurology. Medical Director of [facility].

## 2021-05-05 ENCOUNTER — Ambulatory Visit: Payer: Medicare Other | Admitting: Neurology

## 2021-05-24 NOTE — Progress Notes (Signed)
Good compliance by day to day use, but average hours of therapy too low, needs to aim for 4 hours or more. DAILY.

## 2021-07-17 IMAGING — MR MR HEAD W/O CM
10 series · 48 of 48 positions shown · non-contrast
Comparison: 06/12/2048

CLINICAL DATA: Short-term memory loss for 1 year. Headaches and
right-sided weakness

EXAM:
MRI HEAD WITHOUT CONTRAST
TECHNIQUE: Multiplanar, multiecho pulse sequences of the brain and surrounding
structures were obtained without intravenous contrast.

[Series 2: T1 · sagittal · 5.0mm · 0.45mm/px · 3 of 25 slices shown]
[im 1/25]
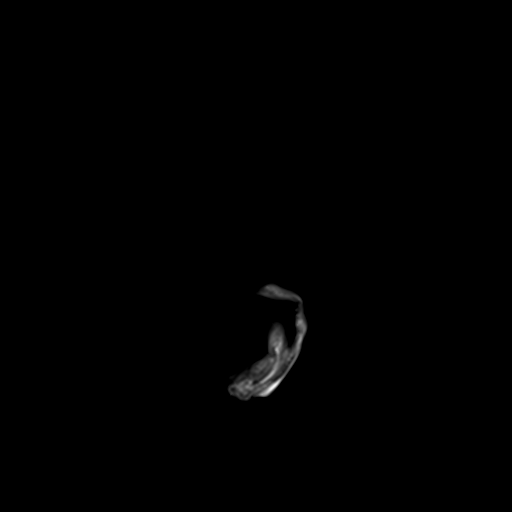
[im 13/25]
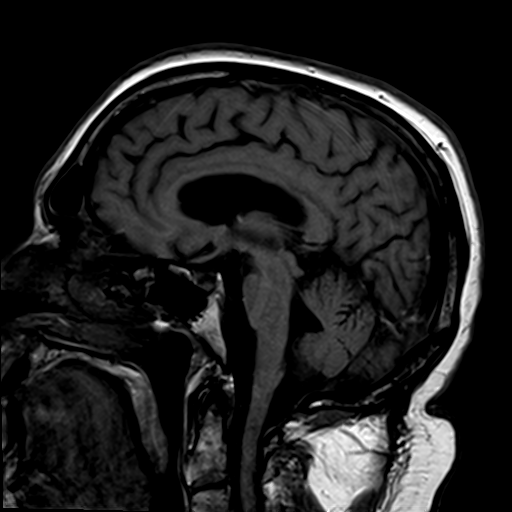
[im 25/25]
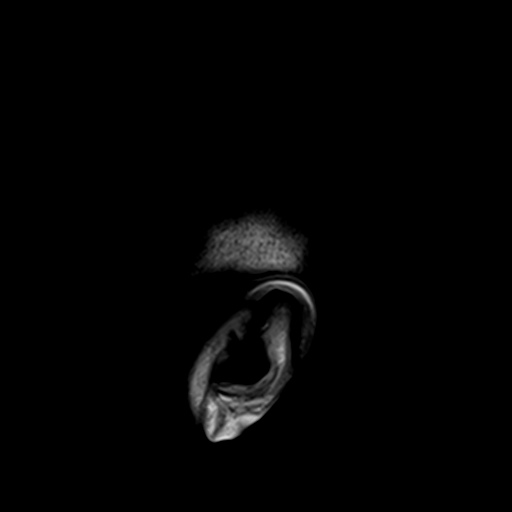

[Series 3: DWI · axial · 3.0mm · 1.88mm/px · z∈[-74,+88]mm · 9 of 110 slices shown (1 of 4)]
[im 1/110]
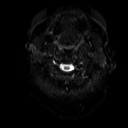
[im 14/110]
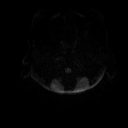
[im 28/110]
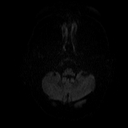
[im 41/110]
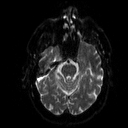
[im 55/110]
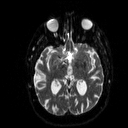
[im 69/110]
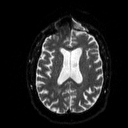
[im 82/110]
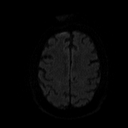
[im 96/110]
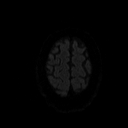
[im 110/110]
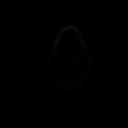

[Series 4: DWI · axial · 3.0mm · 1.88mm/px · z∈[-74,+88]mm · 4 of 55 slices shown (2 of 4)]
[im 1/55]
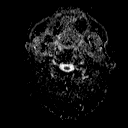
[im 19/55]
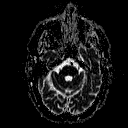
[im 37/55]
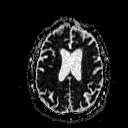
[im 55/55]
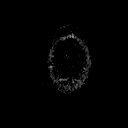

[Series 5: DWI · coronal · 5.0mm · 1.80mm/px · 7 of 85 slices shown (3 of 4)]
[im 1/85]
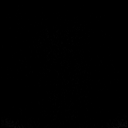
[im 15/85]
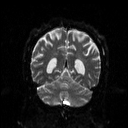
[im 29/85]
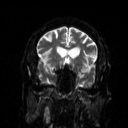
[im 43/85]
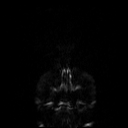
[im 57/85]
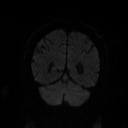
[im 71/85]
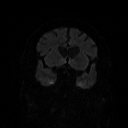
[im 85/85]
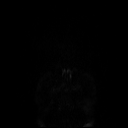

[Series 6: DWI · coronal · 5.0mm · 1.80mm/px · 3 of 43 slices shown (4 of 4)]
[im 1/43]
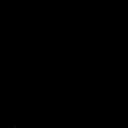
[im 22/43]
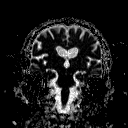
[im 43/43]
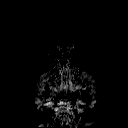

[Series 7: T2 · axial · 5.0mm · 0.54mm/px · z∈[-77,+110]mm · 2 of 28 slices shown (1 of 2)]
[im 1/28]
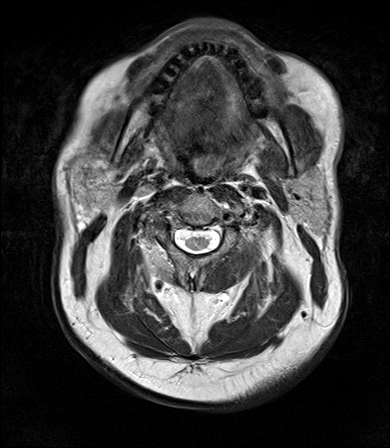
[im 28/28]
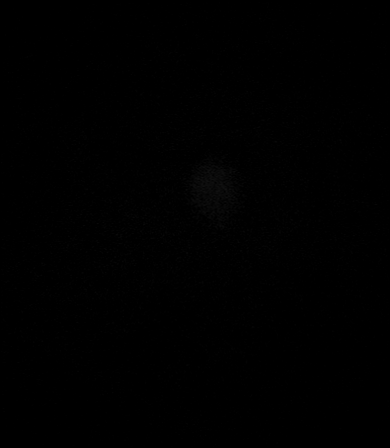

[Series 8: FLAIR · axial · 3.0mm · 0.47mm/px · z∈[-73,+106]mm · 3 of 40 slices shown]
[im 1/40]
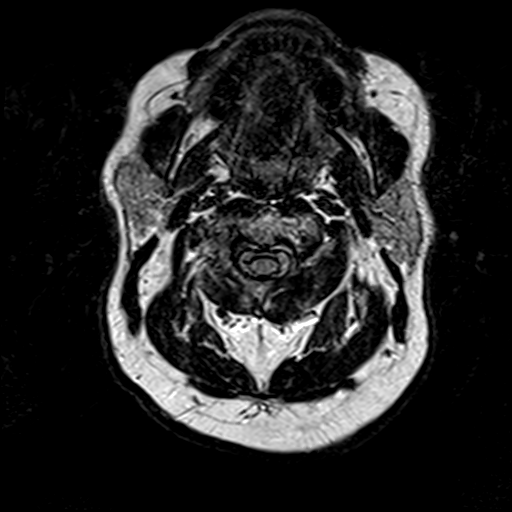
[im 20/40]
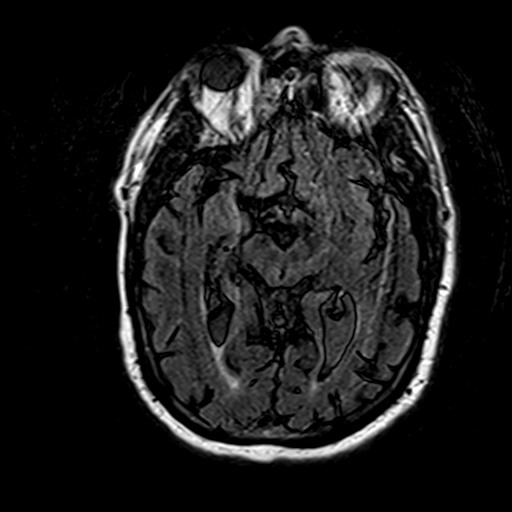
[im 40/40]
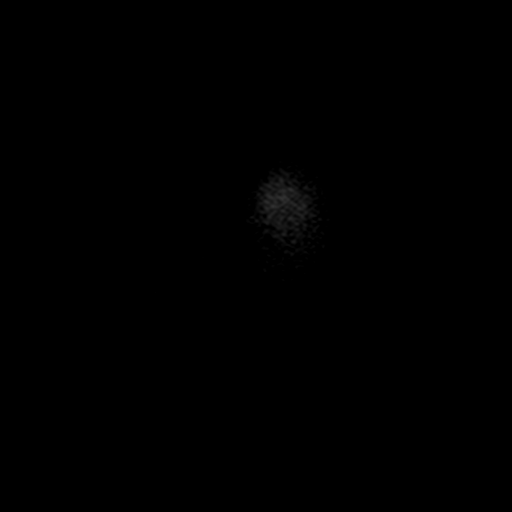

[Series 10: swi_images · axial · 4.0mm · 0.94mm/px · z∈[-56,+99]mm · 3 of 40 slices shown]
[im 1/40]
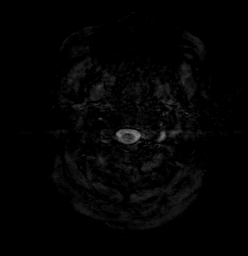
[im 20/40]
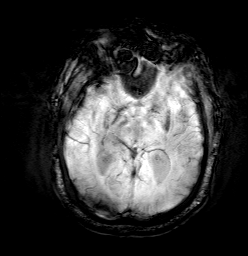
[im 40/40]
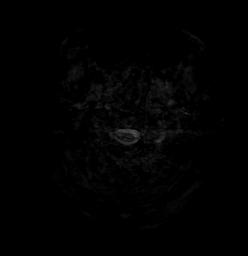

[Series 11: t1_mpr_tra · axial · 1.0mm · 0.72mm/px · z∈[-48,+94]mm · 11 of 144 slices shown]
[im 1/144]
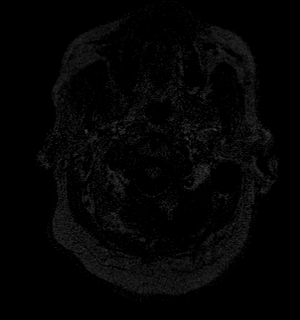
[im 15/144]
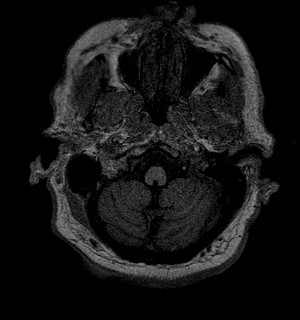
[im 29/144]
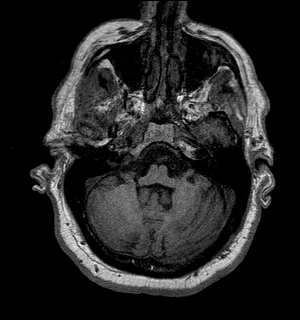
[im 43/144]
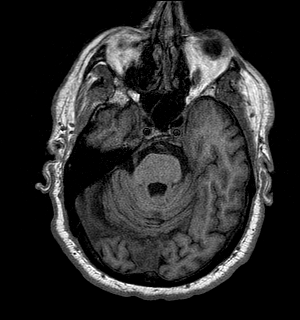
[im 58/144]
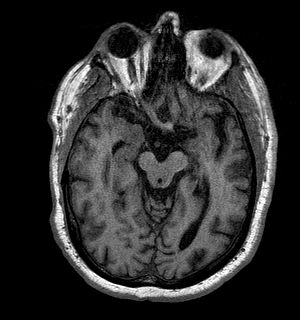
[im 72/144]
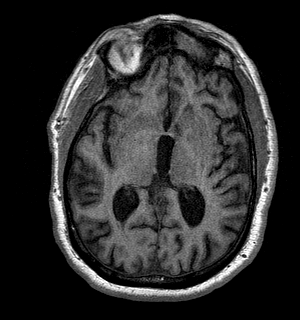
[im 86/144]
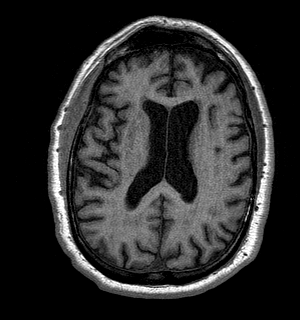
[im 101/144]
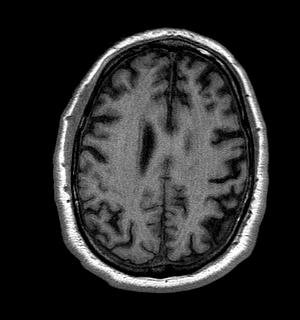
[im 115/144]
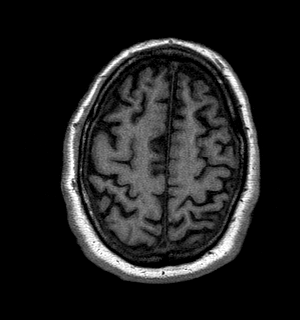
[im 129/144]
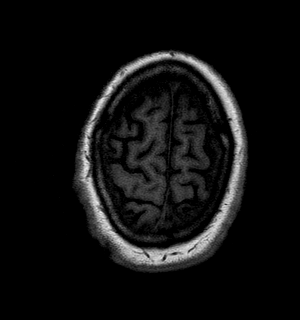
[im 144/144]
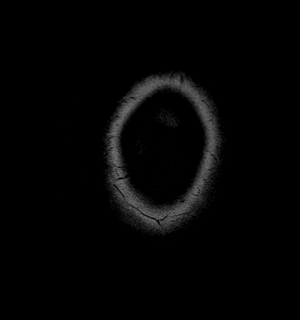

[Series 12: T2 · coronal · 5.0mm · 0.45mm/px · 3 of 32 slices shown (2 of 2)]
[im 1/32]
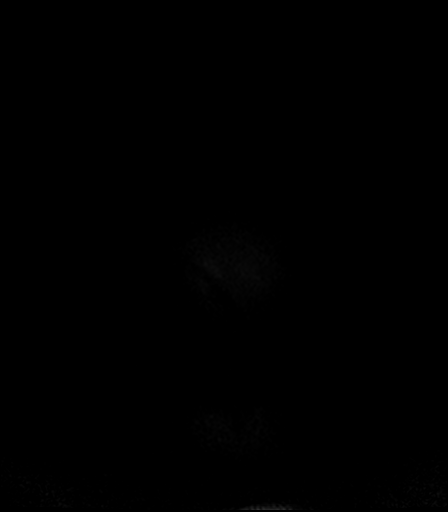
[im 16/32]
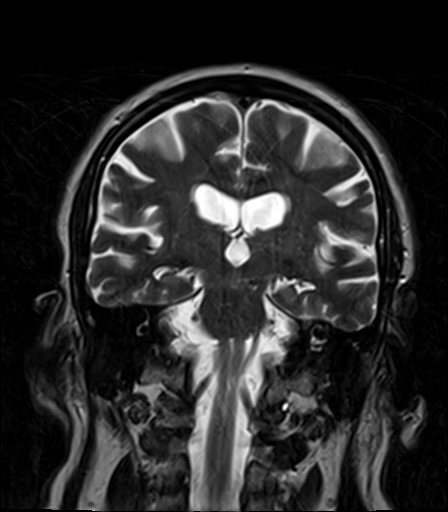
[im 32/32]
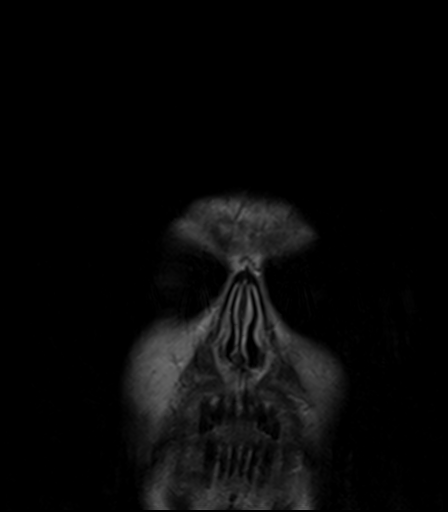

[48 of 48 positions shown; findings below may reference images not displayed]

FINDINGS: Brain: Generalized cerebral volume loss since prior, subjectively
moderate. Mild for age chronic small vessel ischemic change in the
periventricular white matter. No chronic blood products, cortical
infarct, hydrocephalus, collection, or mass.

Vascular: Normal flow voids

Skull and upper cervical spine: Normal marrow signal

Sinuses/Orbits: Mucosal thickening in the frontal and ethmoid
sinuses with prior endoscopic sinus surgery. The left frontal sinus
remains completely opacified with central inspissated secretion
appearance. Bilateral cataract resection since prior.
IMPRESSION: 1. Nonspecific brain atrophy, a change since [DATE]. No reversible finding.
3. Chronic sinusitis without progression from prior.

## 2021-08-30 ENCOUNTER — Telehealth: Payer: Self-pay | Admitting: Hematology and Oncology

## 2021-08-30 NOTE — Telephone Encounter (Signed)
Scheduled appt per 7/24 referral. Pt's wife is aware of appt date and time. Pt's wife is aware to arrive 15 mins prior to appt time and to bring and updated insurance card. Pt's wife is aware of appt location.

## 2021-09-15 ENCOUNTER — Telehealth: Payer: Self-pay | Admitting: Hematology and Oncology

## 2021-09-15 NOTE — Telephone Encounter (Signed)
R/s pt's new hem appt. Called pt, no answer. Left msg with new appt date/time. Requested for pt to call back to confirm new appt.

## 2021-09-21 ENCOUNTER — Inpatient Hospital Stay: Payer: Medicare Other | Admitting: Hematology and Oncology

## 2021-09-21 ENCOUNTER — Inpatient Hospital Stay: Payer: Medicare Other

## 2021-09-30 ENCOUNTER — Other Ambulatory Visit: Payer: Self-pay

## 2021-09-30 ENCOUNTER — Inpatient Hospital Stay: Payer: Medicare Other | Attending: Hematology and Oncology | Admitting: Hematology and Oncology

## 2021-09-30 ENCOUNTER — Inpatient Hospital Stay: Payer: Medicare Other

## 2021-09-30 ENCOUNTER — Encounter: Payer: Self-pay | Admitting: Hematology and Oncology

## 2021-09-30 VITALS — BP 124/68 | HR 57 | Temp 97.7°F | Resp 18 | Ht 70.0 in | Wt 232.8 lb

## 2021-09-30 DIAGNOSIS — E119 Type 2 diabetes mellitus without complications: Secondary | ICD-10-CM

## 2021-09-30 DIAGNOSIS — D72820 Lymphocytosis (symptomatic): Secondary | ICD-10-CM | POA: Insufficient documentation

## 2021-09-30 DIAGNOSIS — E785 Hyperlipidemia, unspecified: Secondary | ICD-10-CM | POA: Diagnosis not present

## 2021-09-30 DIAGNOSIS — I10 Essential (primary) hypertension: Secondary | ICD-10-CM | POA: Insufficient documentation

## 2021-09-30 LAB — COMPREHENSIVE METABOLIC PANEL
ALT: 23 U/L (ref 0–44)
AST: 17 U/L (ref 15–41)
Albumin: 4.6 g/dL (ref 3.5–5.0)
Alkaline Phosphatase: 69 U/L (ref 38–126)
Anion gap: 6 (ref 5–15)
BUN: 15 mg/dL (ref 8–23)
CO2: 29 mmol/L (ref 22–32)
Calcium: 9.8 mg/dL (ref 8.9–10.3)
Chloride: 105 mmol/L (ref 98–111)
Creatinine, Ser: 1.5 mg/dL — ABNORMAL HIGH (ref 0.61–1.24)
GFR, Estimated: 47 mL/min — ABNORMAL LOW (ref >60)
Glucose, Bld: 147 mg/dL — ABNORMAL HIGH (ref 70–99)
Potassium: 4.7 mmol/L (ref 3.5–5.1)
Sodium: 140 mmol/L (ref 135–145)
Total Bilirubin: 0.6 mg/dL (ref 0.3–1.2)
Total Protein: 7.3 g/dL (ref 6.5–8.1)

## 2021-09-30 LAB — CBC WITH DIFFERENTIAL/PLATELET
Abs Immature Granulocytes: 0.01 10*3/uL (ref 0.00–0.07)
Basophils Absolute: 0.1 10*3/uL (ref 0.0–0.1)
Basophils Relative: 1 %
Eosinophils Absolute: 0.5 10*3/uL (ref 0.0–0.5)
Eosinophils Relative: 4 %
HCT: 42.5 % (ref 39.0–52.0)
Hemoglobin: 13.3 g/dL (ref 13.0–17.0)
Immature Granulocytes: 0 %
Lymphocytes Relative: 61 %
Lymphs Abs: 7 10*3/uL — ABNORMAL HIGH (ref 0.7–4.0)
MCH: 27 pg (ref 26.0–34.0)
MCHC: 31.3 g/dL (ref 30.0–36.0)
MCV: 86.2 fL (ref 80.0–100.0)
Monocytes Absolute: 0.7 10*3/uL (ref 0.1–1.0)
Monocytes Relative: 6 %
Neutro Abs: 3.3 10*3/uL (ref 1.7–7.7)
Neutrophils Relative %: 28 %
Platelets: 186 10*3/uL (ref 150–400)
RBC: 4.93 MIL/uL (ref 4.22–5.81)
RDW: 14.8 % (ref 11.5–15.5)
WBC: 11.5 10*3/uL — ABNORMAL HIGH (ref 4.0–10.5)
nRBC: 0 % (ref 0.0–0.2)

## 2021-09-30 LAB — LACTATE DEHYDROGENASE: LDH: 142 U/L (ref 98–192)

## 2021-09-30 NOTE — Progress Notes (Signed)
[facility] CONSULT NOTE  Patient Care Team: Crist Infante, MD as PCP - General (Internal Medicine) Minus Breeding, MD as PCP - Cardiology (Cardiology)  CHIEF COMPLAINTS/PURPOSE OF CONSULTATION:  Lymphocytosis  ASSESSMENT & PLAN:   This is a very pleasant 81 year old male patient with past medical history significant for diabetes, hypertension, dyslipidemia referred to hematology for evaluation of lymphocytosis. Patient arrived to the appointment today with his wife.  Besides some need for an afternoon nap, he denies any unusual fatigue.  No fevers, drenching night sweats, loss of appetite or loss of weight.  No recurrent infections.  Physical examination today with no palpable lymphadenopathy, hepatomegaly, spleen exam limited because of abdominal obesity but no overt splenomegaly.  I have reviewed his CBC for several months, he does have slowly increasing leukocytosis and lymphocytosis with no anemia or thrombocytopenia.  We have discussed the following details about lymphocytosis Lymphocytosis refers to an increase of peripheral blood lymphocytes, which for adults corresponds to >4000 lymphocytes/microL in most clinical laboratories. Common causes of lymphocytosis are viral illness, drug hypersensitivity reaction, stress, polyclonal B cell lymphocytosis in people who smoke, asplenia, and some clonal B cell process including lymphomas and leukemia. Initial evaluation usually consists of CBC, CMP, LDH and flow cytometry. Today we have decided to proceed with evaluation as mentioned above.  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 Patient will return to clinic in 4 weeks to discuss results and additional recommendations.   HISTORY OF PRESENTING ILLNESS:   Todd Cummings 81 y.o. male is here because of lymphocytosis.  This is a very pleasant 81 year old male patient with past medical  history significant for diabetes, hypertension, arthritis, benign prostatic hyperplasia, GERD related issues referred to hematology for progressive lymphocytosis and leukocytosis.  Patient denied any complaints.  He still works 2-1/2 days for an [employer].  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  No fevers or drenching night sweats.  His appetite is great, no weight loss reported.  His wife says that he takes a nap every day when he comes from work for about 2 hours but he has short sleep at night from around 9-2 30.  This has been an old habit.  He otherwise denies any recurrent infections. Rest of the pertinent 10 point ROS reviewed and negative  REVIEW OF SYSTEMS:   Constitutional: Denies fevers, chills or abnormal night sweats Eyes: Denies blurriness of vision, double vision or watery eyes Ears, nose, mouth, throat, and face: Denies mucositis or sore throat Respiratory: Denies cough, dyspnea or wheezes Cardiovascular: Denies palpitation, chest discomfort or lower extremity swelling Gastrointestinal:  Denies nausea, heartburn or change in bowel habits Skin: Denies abnormal skin rashes Lymphatics: Denies new lymphadenopathy or easy bruising Neurological:Denies numbness, tingling or new weaknesses Behavioral/Psych: Mood is stable, no new changes  All other systems were reviewed with the patient and are negative.  MEDICAL HISTORY:  Past Medical History:  Diagnosis Date   Arthritis    BPH (benign prostatic hypertrophy)    Bronchospasm    Chronic sinusitis    DDD (degenerative disc disease), lumbar    Esophageal stricture    GERD (gastroesophageal reflux disease)    Heart disease    HLD (hyperlipidemia)    HTN (hypertension)    Kidney stones    Snoring     SURGICAL HISTORY: Past Surgical History:  Procedure Laterality Date   CATARACT EXTRACTION Bilateral  COLONOSCOPY     ESOPHAGEAL DILATION  10/02   ESOPHAGOGASTRODUODENOSCOPY (EGD)  WITH PROPOFOL N/A 01/16/2015   Procedure: ESOPHAGOGASTRODUODENOSCOPY (EGD) WITH PROPOFOL;  Surgeon: Laurence Spates, MD;  Location: [facility];  Service: Endoscopy;  Laterality: N/A;   HEEL SPUR SURGERY  4/02   NASAL SINUS SURGERY     SAVORY DILATION N/A 01/16/2015   Procedure: SAVORY DILATION;  Surgeon: Laurence Spates, MD;  Location: [facility];  Service: Endoscopy;  Laterality: N/A;   TRANSURETHRAL RESECTION OF PROSTATE  6/02    SOCIAL HISTORY: Social History   Socioeconomic History   Marital status: Married    Spouse name: Not on file   Number of children: 3   Years of education: some college   Highest education level: Not on file  Occupational History   Occupation: Chief Strategy Officer - [employer]  Tobacco Use   Smoking status: Former    Packs/day: 0.50    Years: 35.00    Total pack years: 17.50    Types: Cigarettes    Quit date: 01/07/2002    Years since quitting: 19.7   Smokeless tobacco: Former  Scientific laboratory technician Use: Never used  Substance and Sexual Activity   Alcohol use: Yes    Comment: 1-2 drinks per week   Drug use: Never   Sexual activity: Yes  Other Topics Concern   Not on file  Social History Narrative   Patient lives at home with wife.   Right-handed.   Rare caffeine use.   Social Determinants of Health   Financial Resource Strain: Not on file  Food Insecurity: Not on file  Transportation Needs: Not on file  Physical Activity: Not on file  Stress: Not on file  Social Connections: Not on file  Intimate Partner Violence: Not on file    FAMILY HISTORY: Family History  Adopted: Yes  Family history unknown: Yes    ALLERGIES:  has No Known Allergies.  MEDICATIONS:  Current Outpatient Medications  Medication Sig Dispense Refill   acetaminophen (TYLENOL) 650 MG CR tablet Take 650 mg by mouth daily as needed for pain.     albuterol (VENTOLIN HFA) 108 (90 Base) MCG/ACT inhaler Inhale 2 puffs into the lungs every 6 (six) hours as needed for wheezing or  shortness of breath.     ALPRAZolam (XANAX) 0.25 MG tablet Take 1 tablet (0.25 mg total) by mouth at bedtime as needed for anxiety. 10 tablet 0   aspirin 81 MG tablet Take 81 mg by mouth at bedtime.     atorvastatin (LIPITOR) 80 MG tablet Take 80 mg by mouth every evening.     chlorthalidone (HYGROTON) 25 MG tablet Take 25 mg by mouth daily.     donepezil (ARICEPT) 10 MG tablet Take 10 mg by mouth at bedtime.     finasteride (PROSCAR) 5 MG tablet Take 5 mg by mouth 2 (two) times a day.      fluticasone (FLONASE) 50 MCG/ACT nasal spray Place 2 sprays into both nostrils daily as needed for allergies.      losartan (COZAAR) 100 MG tablet Take 100 mg by mouth at bedtime.     metFORMIN (GLUCOPHAGE-XR) 500 MG 24 hr tablet Take 1 tablet by mouth 2 (two) times daily.     naproxen sodium (ANAPROX) 220 MG tablet Take 220 mg by mouth 2 (two) times daily with a meal.     niacin (NIASPAN) 1000 MG CR tablet Take 1,000 mg by mouth at bedtime.     Omega-3 Fatty  Acids (FISH OIL) 1000 MG CAPS Take 2,000 mg by mouth daily.      omeprazole (PRILOSEC OTC) 20 MG tablet Take 20 mg by mouth daily.     traZODone (DESYREL) 50 MG tablet Take 1 tablet (50 mg total) by mouth at bedtime as needed for sleep. 30 tablet 5   vitamin C (ASCORBIC ACID) 500 MG tablet Take 500 mg by mouth daily.     Zinc 30 MG TABS Take 1 tablet by mouth daily.     No current facility-administered medications for this visit.     PHYSICAL EXAMINATION: ECOG PERFORMANCE STATUS: 0 - Asymptomatic  Vitals:   09/30/21 1013  BP: 124/68  Pulse: (!) 57  Resp: 18  Temp: 97.7 F (36.5 C)  SpO2: 98%   Filed Weights   09/30/21 1013  Weight: 232 lb 12.8 oz (105.6 kg)    GENERAL:alert, no distress and comfortable SKIN: skin color, texture, turgor are normal, no rashes or significant lesions EYES: normal, conjunctiva are pink and non-injected, sclera clear OROPHARYNX:no exudate, no erythema and lips, buccal mucosa, and tongue normal  NECK:  supple, thyroid normal size, non-tender, without nodularity LYMPH:  no palpable lymphadenopathy in the cervical, axillary LUNGS: clear to auscultation and percussion with normal breathing effort HEART: regular rate & rhythm and no murmurs and no lower extremity edema ABDOMEN:abdomen soft, non-tender and normal bowel sounds Musculoskeletal:no cyanosis of digits and no clubbing  PSYCH: alert & oriented x 3 with fluent speech NEURO: no focal motor/sensory deficits  LABORATORY DATA:  I have reviewed the data as listed Lab Results  Component Value Date   WBC 11.5 (H) 09/30/2021   HGB 13.3 09/30/2021   HCT 42.5 09/30/2021   MCV 86.2 09/30/2021   PLT 186 09/30/2021     Chemistry      Component Value Date/Time   NA 139 03/06/2019 2006   K 4.2 03/06/2019 2006   CL 102 03/06/2019 2006   CO2 25 03/06/2019 2006   BUN 21 03/06/2019 2006   CREATININE 1.34 (H) 03/06/2019 2006      Component Value Date/Time   CALCIUM 9.3 03/06/2019 2006   ALKPHOS 92 03/06/2019 2006   AST 33 03/06/2019 2006   ALT 37 03/06/2019 2006   BILITOT 0.9 03/06/2019 2006       RADIOGRAPHIC STUDIES: I have personally reviewed the radiological images as listed and agreed with the findings in the report. No results found.  All questions were answered. The patient knows to call the clinic with any problems, questions or concerns. I spent 45 minutes in the care of this patient including H and P, review of records, counseling and coordination of care.     Benay Pike, MD 09/30/2021 11:17 AM

## 2021-10-01 LAB — SURGICAL PATHOLOGY

## 2021-10-04 LAB — FLOW CYTOMETRY

## 2021-10-28 ENCOUNTER — Inpatient Hospital Stay: Payer: Medicare Other | Attending: Hematology and Oncology | Admitting: Hematology and Oncology

## 2021-10-28 ENCOUNTER — Encounter: Payer: Self-pay | Admitting: Hematology and Oncology

## 2021-10-28 DIAGNOSIS — C9110 Chronic lymphocytic leukemia of B-cell type not having achieved remission: Secondary | ICD-10-CM

## 2021-10-28 DIAGNOSIS — D72820 Lymphocytosis (symptomatic): Secondary | ICD-10-CM | POA: Diagnosis not present

## 2021-10-28 NOTE — Progress Notes (Signed)
[facility] CONSULT NOTE  Patient Care Team: Crist Infante, MD as PCP - General (Internal Medicine) Minus Breeding, MD as PCP - Cardiology (Cardiology)  CHIEF COMPLAINTS/PURPOSE OF CONSULTATION:  Lymphocytosis  ASSESSMENT & PLAN:   This is a very pleasant 81 year old male patient with past medical history significant for diabetes, hypertension, dyslipidemia referred to hematology for evaluation of lymphocytosis.  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 CLL/SLL is considered to be mainly a disease of older adults, with a median age at diagnosis of approximately 11 years. Not all patient with CLL need treatment. Treatment is indicated for patients with following disease related complications termed active disease.  Evidence of progressive marrow failure as manifested by development of worsening anemia or thrombocytopenia. Hemoglobin <10 g/dL or platelet count <100,000/microL are generally regarded as indications for treatment. However, platelet counts <100,000/microL may remain stable over a long period of time in some patients, and this situation does not automatically require treatment. Massive or progressive splenomegaly Massive or progressive or symptomatic lymphadenopathy Progressive lymphocytosis with an increase of > 50 % over two month period of LDT of less than 6 months ( softer indication) Constitutional symptoms. Symptomatic or functional extranodal involvement.  We have discussed monitoring in patients without active disease every 3/4 months with repeat labs. Baseline imaging ordered He will RTC in 3/4 months with repeat labs.  HISTORY OF PRESENTING ILLNESS:   Todd Cummings 81 y.o. male is here because of lymphocytosis.  This is a very pleasant  81 year old male patient with past medical history significant for diabetes, hypertension, arthritis, benign prostatic hyperplasia, GERD related issues referred to hematology for progressive lymphocytosis and leukocytosis.    Interval History  [name] is here for a telephone visit.  His wife was on the phone as well. He denies any new complaints except for sleepiness. Rest of the pertinent 10 point ROS reviewed and negative.  MEDICAL HISTORY:  Past Medical History:  Diagnosis Date   Arthritis    BPH (benign prostatic hypertrophy)    Bronchospasm    Chronic sinusitis    DDD (degenerative disc disease), lumbar    Esophageal stricture    GERD (gastroesophageal reflux disease)    Heart disease    HLD (hyperlipidemia)    HTN (hypertension)    Kidney stones    Snoring     SURGICAL HISTORY: Past Surgical History:  Procedure Laterality Date   CATARACT EXTRACTION Bilateral    COLONOSCOPY     ESOPHAGEAL DILATION  10/02   ESOPHAGOGASTRODUODENOSCOPY (EGD) WITH PROPOFOL N/A 01/16/2015   Procedure: ESOPHAGOGASTRODUODENOSCOPY (EGD) WITH PROPOFOL;  Surgeon: Laurence Spates, MD;  Location: [facility];  Service: Endoscopy;  Laterality: N/A;   HEEL SPUR SURGERY  4/02   NASAL SINUS SURGERY     SAVORY DILATION N/A 01/16/2015   Procedure: SAVORY DILATION;  Surgeon: Laurence Spates, MD;  Location: [facility];  Service: Endoscopy;  Laterality: N/A;   TRANSURETHRAL RESECTION OF PROSTATE  6/02    SOCIAL HISTORY: Social History   Socioeconomic History   Marital status: Married    Spouse name: Not on file   Number of children: 3   Years of education: some college   Highest education level: Not on file  Occupational History   Occupation: Chief Strategy Officer - [employer]  Tobacco Use  Smoking status: Former    Packs/day: 0.50    Years: 35.00    Total pack years: 17.50    Types: Cigarettes    Quit date: 01/07/2002    Years since quitting: 19.8   Smokeless tobacco: Former  Scientific laboratory technician  Use: Never used  Substance and Sexual Activity   Alcohol use: Yes    Comment: 1-2 drinks per week   Drug use: Never   Sexual activity: Yes  Other Topics Concern   Not on file  Social History Narrative   Patient lives at home with wife.   Right-handed.   Rare caffeine use.   Social Determinants of Health   Financial Resource Strain: Not on file  Food Insecurity: Not on file  Transportation Needs: Not on file  Physical Activity: Not on file  Stress: Not on file  Social Connections: Not on file  Intimate Partner Violence: Not on file    FAMILY HISTORY: Family History  Adopted: Yes  Family history unknown: Yes    ALLERGIES:  has No Known Allergies.  MEDICATIONS:  Current Outpatient Medications  Medication Sig Dispense Refill   acetaminophen (TYLENOL) 650 MG CR tablet Take 650 mg by mouth daily as needed for pain.     albuterol (VENTOLIN HFA) 108 (90 Base) MCG/ACT inhaler Inhale 2 puffs into the lungs every 6 (six) hours as needed for wheezing or shortness of breath.     ALPRAZolam (XANAX) 0.25 MG tablet Take 1 tablet (0.25 mg total) by mouth at bedtime as needed for anxiety. 10 tablet 0   aspirin 81 MG tablet Take 81 mg by mouth at bedtime.     atorvastatin (LIPITOR) 80 MG tablet Take 80 mg by mouth every evening.     chlorthalidone (HYGROTON) 25 MG tablet Take 25 mg by mouth daily.     donepezil (ARICEPT) 10 MG tablet Take 10 mg by mouth at bedtime.     finasteride (PROSCAR) 5 MG tablet Take 5 mg by mouth 2 (two) times a day.      fluticasone (FLONASE) 50 MCG/ACT nasal spray Place 2 sprays into both nostrils daily as needed for allergies.      losartan (COZAAR) 100 MG tablet Take 100 mg by mouth at bedtime.     metFORMIN (GLUCOPHAGE-XR) 500 MG 24 hr tablet Take 1 tablet by mouth 2 (two) times daily.     naproxen sodium (ANAPROX) 220 MG tablet Take 220 mg by mouth 2 (two) times daily with a meal.     niacin (NIASPAN) 1000 MG CR tablet Take 1,000 mg by mouth at bedtime.      Omega-3 Fatty Acids (FISH OIL) 1000 MG CAPS Take 2,000 mg by mouth daily.      omeprazole (PRILOSEC OTC) 20 MG tablet Take 20 mg by mouth daily.     traZODone (DESYREL) 50 MG tablet Take 1 tablet (50 mg total) by mouth at bedtime as needed for sleep. 30 tablet 5   vitamin C (ASCORBIC ACID) 500 MG tablet Take 500 mg by mouth daily.     Zinc 30 MG TABS Take 1 tablet by mouth daily.     No current facility-administered medications for this visit.     PHYSICAL EXAMINATION: ECOG PERFORMANCE STATUS: 0 - Asymptomatic  PE not done, telephone visit.  LABORATORY DATA:  I have reviewed the data as listed Lab Results  Component Value Date   WBC 11.5 (H) 09/30/2021   HGB 13.3 09/30/2021   HCT 42.5 09/30/2021  MCV 86.2 09/30/2021   PLT 186 09/30/2021     Chemistry      Component Value Date/Time   NA 140 09/30/2021 1036   K 4.7 09/30/2021 1036   CL 105 09/30/2021 1036   CO2 29 09/30/2021 1036   BUN 15 09/30/2021 1036   CREATININE 1.50 (H) 09/30/2021 1036      Component Value Date/Time   CALCIUM 9.8 09/30/2021 1036   ALKPHOS 69 09/30/2021 1036   AST 17 09/30/2021 1036   ALT 23 09/30/2021 1036   BILITOT 0.6 09/30/2021 1036      I connected with  Todd Cummings on 10/28/21 by a telephone application and verified that I am speaking with the correct person using two identifiers.   I discussed the limitations of evaluation and management by telemedicine. The patient expressed understanding and agreed to proceed.  RADIOGRAPHIC STUDIES: I have personally reviewed the radiological images as listed and agreed with the findings in the report. No results found.  All questions were answered. The patient knows to call the clinic with any problems, questions or concerns. I spent 15 minutes in the care of this patient including H, review of records, counseling and coordination of care.     Benay Pike, MD 10/28/2021 8:45 AM

## 2021-11-16 ENCOUNTER — Ambulatory Visit: Payer: Medicare Other | Admitting: Adult Health

## 2021-11-16 ENCOUNTER — Encounter: Payer: Self-pay | Admitting: Adult Health

## 2021-11-16 VITALS — BP 124/71 | HR 52 | Ht 70.0 in | Wt 228.4 lb

## 2021-11-16 DIAGNOSIS — R413 Other amnesia: Secondary | ICD-10-CM | POA: Diagnosis not present

## 2021-11-16 DIAGNOSIS — G4733 Obstructive sleep apnea (adult) (pediatric): Secondary | ICD-10-CM

## 2021-11-16 NOTE — Progress Notes (Signed)
PATIENT: Todd Cummings DOB: [birth year]  REASON FOR VISIT: follow up HISTORY FROM: patient PRIMARY NEUROLOGIST: Dr. Brett Fairy  Chief Complaint  Patient presents with   Follow-up    Pt in 19 with wife   Pt has not used CPAP since summer Pt states unable to wear mask Pt has tried 3 mask already . Pt states uncomfortable and not able to sleep with mask on.  Pt wants to discuss inspire      HISTORY OF PRESENT ILLNESS: Today 11/16/21  Todd Cummings is a 81 y.o. male with a history of OSA on CPAP. He returns today for follow-up with his wife.  Unable to sleep with CPAP on. Tried 3 different mask. Tried wearing it while awake but still could not get used to it.  He has not used it since July.  Him and his wife sleep in different rooms but she does note that she does not notice him snoring as much as he did when they sleep in the same room on vacation.  Does notice some memory trouble.  MMSE not completed at the last visit with Dr. Brett Fairy.     REVIEW OF SYSTEMS: Out of a complete 14 system review of symptoms, the patient complains only of the following symptoms, and all other reviewed systems are negative.  See hPI  ALLERGIES: No Known Allergies  HOME MEDICATIONS: Outpatient Medications Prior to Visit  Medication Sig Dispense Refill   albuterol (VENTOLIN HFA) 108 (90 Base) MCG/ACT inhaler Inhale 2 puffs into the lungs every 6 (six) hours as needed for wheezing or shortness of breath.     aspirin 81 MG tablet Take 81 mg by mouth at bedtime.     atorvastatin (LIPITOR) 80 MG tablet Take 80 mg by mouth every evening.     donepezil (ARICEPT) 10 MG tablet Take 10 mg by mouth at bedtime.     finasteride (PROSCAR) 5 MG tablet Take 5 mg by mouth 2 (two) times a day.      fluticasone (FLONASE) 50 MCG/ACT nasal spray Place 2 sprays into both nostrils daily as needed for allergies.      losartan (COZAAR) 100 MG tablet Take 100 mg by mouth at bedtime.     metFORMIN (GLUCOPHAGE-XR) 500 MG 24 hr  tablet Take 1 tablet by mouth 2 (two) times daily.     niacin (NIASPAN) 1000 MG CR tablet Take 1,000 mg by mouth at bedtime.     Omega-3 Fatty Acids (FISH OIL) 1000 MG CAPS Take 2,000 mg by mouth daily.      omeprazole (PRILOSEC OTC) 20 MG tablet Take 20 mg by mouth daily.     vitamin C (ASCORBIC ACID) 500 MG tablet Take 500 mg by mouth daily.     Zinc 30 MG TABS Take 1 tablet by mouth daily.     No facility-administered medications prior to visit.    PAST MEDICAL HISTORY: Past Medical History:  Diagnosis Date   Arthritis    BPH (benign prostatic hypertrophy)    Bronchospasm    Chronic sinusitis    DDD (degenerative disc disease), lumbar    Esophageal stricture    GERD (gastroesophageal reflux disease)    Heart disease    HLD (hyperlipidemia)    HTN (hypertension)    Kidney stones    Snoring     PAST SURGICAL HISTORY: Past Surgical History:  Procedure Laterality Date   CATARACT EXTRACTION Bilateral    COLONOSCOPY     ESOPHAGEAL DILATION  10/02   ESOPHAGOGASTRODUODENOSCOPY (EGD) WITH PROPOFOL N/A 01/16/2015   Procedure: ESOPHAGOGASTRODUODENOSCOPY (EGD) WITH PROPOFOL;  Surgeon: Laurence Spates, MD;  Location: [facility];  Service: Endoscopy;  Laterality: N/A;   HEEL SPUR SURGERY  4/02   NASAL SINUS SURGERY     SAVORY DILATION N/A 01/16/2015   Procedure: SAVORY DILATION;  Surgeon: Laurence Spates, MD;  Location: [facility];  Service: Endoscopy;  Laterality: N/A;   TRANSURETHRAL RESECTION OF PROSTATE  6/02    FAMILY HISTORY: Family History  Adopted: Yes  Family history unknown: Yes    SOCIAL HISTORY: Social History   Socioeconomic History   Marital status: Married    Spouse name: Not on file   Number of children: 3   Years of education: some college   Highest education level: Not on file  Occupational History   Occupation: Chief Strategy Officer - [employer]  Tobacco Use   Smoking status: Former    Packs/day: 0.50    Years: 35.00    Total pack years: 17.50    Types:  Cigarettes    Quit date: 01/07/2002    Years since quitting: 19.8   Smokeless tobacco: Former  Scientific laboratory technician Use: Never used  Substance and Sexual Activity   Alcohol use: Yes    Comment: 1-2 drinks per week   Drug use: Never   Sexual activity: Yes  Other Topics Concern   Not on file  Social History Narrative   Patient lives at home with wife.   Right-handed.   Rare caffeine use.   Social Determinants of Health   Financial Resource Strain: Not on file  Food Insecurity: Not on file  Transportation Needs: Not on file  Physical Activity: Not on file  Stress: Not on file  Social Connections: Not on file  Intimate Partner Violence: Not on file      PHYSICAL EXAM  Vitals:   11/16/21 0835  BP: 124/71  Pulse: (!) 52  Weight: 228 lb 6.4 oz (103.6 kg)  Height: '5\' 10"'$  (1.778 m)   Body mass index is 32.77 kg/m.     11/16/2021    8:38 AM  MMSE - Mini Mental State Exam  Orientation to time 4  Orientation to Place 4  Registration 3  Attention/ Calculation 4  Recall 2  Language- name 2 objects 2  Language- repeat 1  Language- follow 3 step command 3  Language- read & follow direction 1  Write a sentence 1  Copy design 1  Total score 26     Generalized: Well developed, in no acute distress  Chest: Lungs clear to auscultation bilaterally  Neurological examination  Mentation: Alert oriented to time, place, history taking. Follows all commands speech and language fluent Cranial nerve II-XII: Extraocular movements were full, visual field were full on confrontational test Head turning and shoulder shrug  were normal and symmetric.  Gait and station: Gait is normal.    DIAGNOSTIC DATA (LABS, IMAGING, TESTING) - I reviewed patient records, labs, notes, testing and imaging myself where available.  Lab Results  Component Value Date   WBC 11.5 (H) 09/30/2021   HGB 13.3 09/30/2021   HCT 42.5 09/30/2021   MCV 86.2 09/30/2021   PLT 186 09/30/2021       Component Value Date/Time   NA 140 09/30/2021 1036   K 4.7 09/30/2021 1036   CL 105 09/30/2021 1036   CO2 29 09/30/2021 1036   GLUCOSE 147 (H) 09/30/2021 1036   BUN 15 09/30/2021 1036  CREATININE 1.50 (H) 09/30/2021 1036   CALCIUM 9.8 09/30/2021 1036   PROT 7.3 09/30/2021 1036   ALBUMIN 4.6 09/30/2021 1036   AST 17 09/30/2021 1036   ALT 23 09/30/2021 1036   ALKPHOS 69 09/30/2021 1036   BILITOT 0.6 09/30/2021 1036   GFRNONAA 47 (L) 09/30/2021 1036   GFRAA 59 (L) 03/06/2019 2006   Lab Results  Component Value Date   CHOL 144 06/13/2013   HDL 48 06/13/2013   LDLCALC 73 06/13/2013   TRIG 116 06/13/2013   CHOLHDL 3.0 06/13/2013   Lab Results  Component Value Date   HGBA1C 6.0 (H) 06/12/2013   No results found for: "VITAMINB12" No results found for: "TSH"    ASSESSMENT AND PLAN 81 y.o. year old male  has a past medical history of Arthritis, BPH (benign prostatic hypertrophy), Bronchospasm, Chronic sinusitis, DDD (degenerative disc disease), lumbar, Esophageal stricture, GERD (gastroesophageal reflux disease), Heart disease, HLD (hyperlipidemia), HTN (hypertension), Kidney stones, and Snoring. here with:  OSA on CPAP 2.  Memory disturbance  Reviewed the patient's sleep study with him and his wife.  He does have severe sleep apnea.  Discussed other potential treatment options such as dental device and inspire device.  Patient does not want to proceed with either 1.  He states that he will give a more concerted effort to try to use the CPAP.  If he is unable to get used to it then he will leave his apnea untreated.  I did review the risk associated with untreated sleep apnea.  His MMSE today was 26 out of 30.  Certainly untreated sleep apnea may be contributing to memory deficit.  Patient was made aware.  He will follow-up in 6 months if he continues to use a CPAP.    Ward Givens, MSN, NP-C 11/16/2021, 8:33 AM [facility] [address], [place], [place] [address] [phone]

## 2021-11-17 ENCOUNTER — Encounter (HOSPITAL_COMMUNITY)
Admission: RE | Admit: 2021-11-17 | Discharge: 2021-11-17 | Disposition: A | Discharge status: Home / Self Care | Payer: Medicare Other | Source: Ambulatory Visit | Attending: Hematology and Oncology | Admitting: Hematology and Oncology

## 2021-11-17 DIAGNOSIS — D72820 Lymphocytosis (symptomatic): Secondary | ICD-10-CM | POA: Diagnosis present

## 2021-11-17 DIAGNOSIS — C9110 Chronic lymphocytic leukemia of B-cell type not having achieved remission: Secondary | ICD-10-CM | POA: Insufficient documentation

## 2021-11-17 LAB — GLUCOSE, CAPILLARY: Glucose-Capillary: 143 mg/dL — ABNORMAL HIGH (ref 70–99)

## 2021-11-17 MED ORDER — FLUDEOXYGLUCOSE F - 18 (FDG) INJECTION
11.4000 | Freq: Once | INTRAVENOUS | Status: AC | PRN
  Administered 2021-11-17: 11.13 via INTRAVENOUS

## 2022-01-06 ENCOUNTER — Encounter: Payer: Self-pay | Admitting: Hematology and Oncology

## 2022-02-18 ENCOUNTER — Inpatient Hospital Stay: Payer: Medicare Other | Attending: Hematology and Oncology | Admitting: Hematology and Oncology

## 2022-02-18 ENCOUNTER — Inpatient Hospital Stay: Payer: Medicare Other | Admitting: Hematology and Oncology

## 2022-02-18 ENCOUNTER — Other Ambulatory Visit: Payer: Self-pay

## 2022-02-18 ENCOUNTER — Encounter: Payer: Self-pay | Admitting: Hematology and Oncology

## 2022-02-18 VITALS — BP 136/77 | HR 60 | Temp 98.1°F | Resp 18 | Ht 70.0 in | Wt 230.5 lb

## 2022-02-18 DIAGNOSIS — Z87891 Personal history of nicotine dependence: Secondary | ICD-10-CM | POA: Diagnosis not present

## 2022-02-18 DIAGNOSIS — N400 Enlarged prostate without lower urinary tract symptoms: Secondary | ICD-10-CM | POA: Insufficient documentation

## 2022-02-18 DIAGNOSIS — E785 Hyperlipidemia, unspecified: Secondary | ICD-10-CM | POA: Diagnosis not present

## 2022-02-18 DIAGNOSIS — E119 Type 2 diabetes mellitus without complications: Secondary | ICD-10-CM | POA: Insufficient documentation

## 2022-02-18 DIAGNOSIS — K219 Gastro-esophageal reflux disease without esophagitis: Secondary | ICD-10-CM | POA: Insufficient documentation

## 2022-02-18 DIAGNOSIS — Z79899 Other long term (current) drug therapy: Secondary | ICD-10-CM | POA: Diagnosis not present

## 2022-02-18 DIAGNOSIS — C9110 Chronic lymphocytic leukemia of B-cell type not having achieved remission: Secondary | ICD-10-CM

## 2022-02-18 DIAGNOSIS — M1990 Unspecified osteoarthritis, unspecified site: Secondary | ICD-10-CM | POA: Insufficient documentation

## 2022-02-18 DIAGNOSIS — I10 Essential (primary) hypertension: Secondary | ICD-10-CM | POA: Insufficient documentation

## 2022-02-18 DIAGNOSIS — D72820 Lymphocytosis (symptomatic): Secondary | ICD-10-CM | POA: Diagnosis not present

## 2022-02-18 LAB — CBC WITH DIFFERENTIAL/PLATELET
Abs Immature Granulocytes: 0.02 10*3/uL (ref 0.00–0.07)
Basophils Absolute: 0.1 10*3/uL (ref 0.0–0.1)
Basophils Relative: 1 %
Eosinophils Absolute: 0.3 10*3/uL (ref 0.0–0.5)
Eosinophils Relative: 3 %
HCT: 42.6 % (ref 39.0–52.0)
Hemoglobin: 13.4 g/dL (ref 13.0–17.0)
Immature Granulocytes: 0 %
Lymphocytes Relative: 62 %
Lymphs Abs: 7.7 10*3/uL — ABNORMAL HIGH (ref 0.7–4.0)
MCH: 26.3 pg (ref 26.0–34.0)
MCHC: 31.5 g/dL (ref 30.0–36.0)
MCV: 83.7 fL (ref 80.0–100.0)
Monocytes Absolute: 0.6 10*3/uL (ref 0.1–1.0)
Monocytes Relative: 5 %
Neutro Abs: 3.6 10*3/uL (ref 1.7–7.7)
Neutrophils Relative %: 29 %
Platelets: 222 10*3/uL (ref 150–400)
RBC: 5.09 MIL/uL (ref 4.22–5.81)
RDW: 16.9 % — ABNORMAL HIGH (ref 11.5–15.5)
Smear Review: NORMAL
WBC: 12.4 10*3/uL — ABNORMAL HIGH (ref 4.0–10.5)
nRBC: 0 % (ref 0.0–0.2)

## 2022-02-18 LAB — COMPREHENSIVE METABOLIC PANEL
ALT: 22 U/L (ref 0–44)
AST: 17 U/L (ref 15–41)
Albumin: 4.3 g/dL (ref 3.5–5.0)
Alkaline Phosphatase: 82 U/L (ref 38–126)
Anion gap: 6 (ref 5–15)
BUN: 17 mg/dL (ref 8–23)
CO2: 26 mmol/L (ref 22–32)
Calcium: 10.2 mg/dL (ref 8.9–10.3)
Chloride: 109 mmol/L (ref 98–111)
Creatinine, Ser: 1.6 mg/dL — ABNORMAL HIGH (ref 0.61–1.24)
GFR, Estimated: 44 mL/min — ABNORMAL LOW (ref >60)
Glucose, Bld: 128 mg/dL — ABNORMAL HIGH (ref 70–99)
Potassium: 4.6 mmol/L (ref 3.5–5.1)
Sodium: 141 mmol/L (ref 135–145)
Total Bilirubin: 0.5 mg/dL (ref 0.3–1.2)
Total Protein: 7.4 g/dL (ref 6.5–8.1)

## 2022-02-18 NOTE — Progress Notes (Signed)
Patient called.  Patient aware.  Verbalized understanding and labs faxed to Dr. Haynes Kerns per Dr. Rob Hickman request.

## 2022-02-18 NOTE — Progress Notes (Signed)
[place] [facility] CONSULT NOTE  Patient Care Team: Crist Infante, MD as PCP - General (Internal Medicine) Minus Breeding, MD as PCP - Cardiology (Cardiology)  CHIEF COMPLAINTS/PURPOSE OF CONSULTATION:  Lymphocytosis  ASSESSMENT & PLAN:   This is a very pleasant 82 year old male patient with past medical history significant for diabetes, hypertension, dyslipidemia referred to hematology for evaluation of lymphocytosis.  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 Baseline pet imaging in October showed no enlarged or hypermetabolic lymph nodes to suggest recurrent CLL.  Stable emphysematous changes and pulmonary scarring.  Stable vascular disease. We have discussed monitoring in patients without active disease every 3/4 months with repeat labs and he is here for a follow up. No ROS or PE findings concerning for CLL progression. Labs from early Dec with leukocytosis and lymphocytosis alone. CBC from today also mild leukocytosis. At this time, there is no indication for treatment. He will RTC in 3/4 months for recommendations  HISTORY OF PRESENTING ILLNESS:   Todd Cummings 82 y.o. male is here because of lymphocytosis.  This is a very pleasant 82 year old male patient with past medical history significant for diabetes, hypertension, arthritis, benign prostatic hyperplasia, GERD related issues referred to hematology for progressive lymphocytosis and leukocytosis.    Interval History  Mr Fero is here for a follow up. He denies any new complaints today No B symptoms. No interim infections or hospitalizations. Rest of the pertinent 10 point ROS reviewed and negative.  MEDICAL HISTORY:  Past Medical History:  Diagnosis Date   Arthritis    BPH (benign prostatic hypertrophy)    Bronchospasm    Chronic sinusitis    DDD (degenerative disc disease), lumbar    Esophageal stricture    GERD (gastroesophageal reflux disease)     Heart disease    HLD (hyperlipidemia)    HTN (hypertension)    Kidney stones    Snoring     SURGICAL HISTORY: Past Surgical History:  Procedure Laterality Date   CATARACT EXTRACTION Bilateral    COLONOSCOPY     ESOPHAGEAL DILATION  10/02   ESOPHAGOGASTRODUODENOSCOPY (EGD) WITH PROPOFOL N/A 01/16/2015   Procedure: ESOPHAGOGASTRODUODENOSCOPY (EGD) WITH PROPOFOL;  Surgeon: Laurence Spates, MD;  Location: [facility];  Service: Endoscopy;  Laterality: N/A;   HEEL SPUR SURGERY  4/02   NASAL SINUS SURGERY     SAVORY DILATION N/A 01/16/2015   Procedure: SAVORY DILATION;  Surgeon: Laurence Spates, MD;  Location: [facility];  Service: Endoscopy;  Laterality: N/A;   TRANSURETHRAL RESECTION OF PROSTATE  6/02    SOCIAL HISTORY: Social History   Socioeconomic History   Marital status: Married    Spouse name: Not on file   Number of children: 3   Years of education: some college   Highest education level: Not on file  Occupational History   Occupation: Chief Strategy Officer - [employer]  Tobacco Use   Smoking status: Former    Packs/day: 0.50    Years: 35.00    Total pack years: 17.50    Types: Cigarettes    Quit date: 01/07/2002    Years since quitting: 20.1   Smokeless tobacco: Former  Scientific laboratory technician Use: Never used  Substance and Sexual Activity   Alcohol use: Yes    Alcohol/week: 2.0 standard drinks of alcohol    Types: 2 Shots of liquor per week   Drug use: Never   Sexual activity: Yes  Other Topics Concern   Not  on file  Social History Narrative   Patient lives at home with wife.   Right-handed.   Rare caffeine use.   Social Determinants of Health   Financial Resource Strain: Not on file  Food Insecurity: Not on file  Transportation Needs: Not on file  Physical Activity: Not on file  Stress: Not on file  Social Connections: Not on file  Intimate Partner Violence: Not on file    FAMILY HISTORY: Family History  Adopted: Yes  Problem Relation Age of Onset    Sleep apnea Neg Hx    Alzheimer's disease Neg Hx     ALLERGIES:  has No Known Allergies.  MEDICATIONS:  Current Outpatient Medications  Medication Sig Dispense Refill   albuterol (VENTOLIN HFA) 108 (90 Base) MCG/ACT inhaler Inhale 2 puffs into the lungs every 6 (six) hours as needed for wheezing or shortness of breath.     aspirin 81 MG tablet Take 81 mg by mouth at bedtime.     atorvastatin (LIPITOR) 80 MG tablet Take 80 mg by mouth every evening.     donepezil (ARICEPT) 10 MG tablet Take 10 mg by mouth at bedtime.     finasteride (PROSCAR) 5 MG tablet Take 5 mg by mouth 2 (two) times a day.      fluticasone (FLONASE) 50 MCG/ACT nasal spray Place 2 sprays into both nostrils daily as needed for allergies.      losartan (COZAAR) 100 MG tablet Take 100 mg by mouth at bedtime.     metFORMIN (GLUCOPHAGE-XR) 500 MG 24 hr tablet Take 1 tablet by mouth 2 (two) times daily.     niacin (NIASPAN) 1000 MG CR tablet Take 1,000 mg by mouth at bedtime.     Omega-3 Fatty Acids (FISH OIL) 1000 MG CAPS Take 2,000 mg by mouth daily.      omeprazole (PRILOSEC OTC) 20 MG tablet Take 20 mg by mouth daily.     vitamin C (ASCORBIC ACID) 500 MG tablet Take 500 mg by mouth daily.     Zinc 30 MG TABS Take 1 tablet by mouth daily.     No current facility-administered medications for this visit.     PHYSICAL EXAMINATION: ECOG PERFORMANCE STATUS: 0 - Asymptomatic  Physical Exam Constitutional:      General: He is not in acute distress.    Appearance: Normal appearance.  Cardiovascular:     Rate and Rhythm: Normal rate and regular rhythm.  Pulmonary:     Effort: Pulmonary effort is normal.     Breath sounds: Normal breath sounds.  Abdominal:     General: There is no distension.     Palpations: There is no mass.  Musculoskeletal:        General: No swelling. Normal range of motion.  Lymphadenopathy:     Cervical: No cervical adenopathy.  Neurological:     Mental Status: He is alert.       LABORATORY DATA:  I have reviewed the data as listed Lab Results  Component Value Date   WBC 12.4 (H) 02/18/2022   HGB 13.4 02/18/2022   HCT 42.6 02/18/2022   MCV 83.7 02/18/2022   PLT 222 02/18/2022     Chemistry      Component Value Date/Time   NA 140 09/30/2021 1036   K 4.7 09/30/2021 1036   CL 105 09/30/2021 1036   CO2 29 09/30/2021 1036   BUN 15 09/30/2021 1036   CREATININE 1.50 (H) 09/30/2021 1036      Component Value  Date/Time   CALCIUM 9.8 09/30/2021 1036   ALKPHOS 69 09/30/2021 1036   AST 17 09/30/2021 1036   ALT 23 09/30/2021 1036   BILITOT 0.6 09/30/2021 1036      RADIOGRAPHIC STUDIES: I have personally reviewed the radiological images as listed and agreed with the findings in the report. No results found.  All questions were answered. The patient knows to call the clinic with any problems, questions or concerns. I spent 20 minutes in the care of this patient including H, review of records, counseling and coordination of care.     Benay Pike, MD 02/18/2022 11:45 AM

## 2022-02-22 ENCOUNTER — Encounter: Payer: Self-pay | Admitting: Hematology and Oncology

## 2022-05-11 ENCOUNTER — Other Ambulatory Visit (HOSPITAL_COMMUNITY): Payer: Self-pay

## 2022-05-31 ENCOUNTER — Ambulatory Visit: Payer: Medicare Other | Admitting: Adult Health

## 2022-06-28 ENCOUNTER — Inpatient Hospital Stay (HOSPITAL_BASED_OUTPATIENT_CLINIC_OR_DEPARTMENT_OTHER): Payer: Medicare Other | Admitting: Hematology and Oncology

## 2022-06-28 ENCOUNTER — Encounter: Payer: Self-pay | Admitting: Hematology and Oncology

## 2022-06-28 ENCOUNTER — Inpatient Hospital Stay: Payer: Medicare Other | Attending: Hematology and Oncology

## 2022-06-28 ENCOUNTER — Inpatient Hospital Stay: Payer: Medicare Other

## 2022-06-28 VITALS — BP 110/58 | HR 71 | Temp 98.2°F | Resp 18 | Ht 70.0 in | Wt 226.1 lb

## 2022-06-28 DIAGNOSIS — E119 Type 2 diabetes mellitus without complications: Secondary | ICD-10-CM | POA: Insufficient documentation

## 2022-06-28 DIAGNOSIS — I10 Essential (primary) hypertension: Secondary | ICD-10-CM | POA: Diagnosis not present

## 2022-06-28 DIAGNOSIS — C9111 Chronic lymphocytic leukemia of B-cell type in remission: Secondary | ICD-10-CM | POA: Insufficient documentation

## 2022-06-28 DIAGNOSIS — D72820 Lymphocytosis (symptomatic): Secondary | ICD-10-CM

## 2022-06-28 DIAGNOSIS — N400 Enlarged prostate without lower urinary tract symptoms: Secondary | ICD-10-CM | POA: Insufficient documentation

## 2022-06-28 DIAGNOSIS — E785 Hyperlipidemia, unspecified: Secondary | ICD-10-CM | POA: Diagnosis not present

## 2022-06-28 DIAGNOSIS — M1990 Unspecified osteoarthritis, unspecified site: Secondary | ICD-10-CM | POA: Insufficient documentation

## 2022-06-28 DIAGNOSIS — C9110 Chronic lymphocytic leukemia of B-cell type not having achieved remission: Secondary | ICD-10-CM | POA: Diagnosis not present

## 2022-06-28 DIAGNOSIS — K219 Gastro-esophageal reflux disease without esophagitis: Secondary | ICD-10-CM | POA: Diagnosis not present

## 2022-06-28 LAB — CBC WITH DIFFERENTIAL/PLATELET
Abs Immature Granulocytes: 0.05 10*3/uL (ref 0.00–0.07)
Basophils Absolute: 0.2 10*3/uL — ABNORMAL HIGH (ref 0.0–0.1)
Basophils Relative: 1 %
Eosinophils Absolute: 0.5 10*3/uL (ref 0.0–0.5)
Eosinophils Relative: 3 %
HCT: 44.8 % (ref 39.0–52.0)
Hemoglobin: 13.9 g/dL (ref 13.0–17.0)
Immature Granulocytes: 0 %
Lymphocytes Relative: 56 %
Lymphs Abs: 10.6 10*3/uL — ABNORMAL HIGH (ref 0.7–4.0)
MCH: 26.4 pg (ref 26.0–34.0)
MCHC: 31 g/dL (ref 30.0–36.0)
MCV: 85.2 fL (ref 80.0–100.0)
Monocytes Absolute: 0.8 10*3/uL (ref 0.1–1.0)
Monocytes Relative: 4 %
Neutro Abs: 6.7 10*3/uL (ref 1.7–7.7)
Neutrophils Relative %: 36 %
Platelets: 212 10*3/uL (ref 150–400)
RBC: 5.26 MIL/uL (ref 4.22–5.81)
RDW: 16.9 % — ABNORMAL HIGH (ref 11.5–15.5)
Smear Review: NORMAL
WBC: 18.7 10*3/uL — ABNORMAL HIGH (ref 4.0–10.5)
nRBC: 0 % (ref 0.0–0.2)

## 2022-06-28 LAB — COMPREHENSIVE METABOLIC PANEL
ALT: 23 U/L (ref 0–44)
AST: 21 U/L (ref 15–41)
Albumin: 4.6 g/dL (ref 3.5–5.0)
Alkaline Phosphatase: 83 U/L (ref 38–126)
Anion gap: 8 (ref 5–15)
BUN: 17 mg/dL (ref 8–23)
CO2: 25 mmol/L (ref 22–32)
Calcium: 9.4 mg/dL (ref 8.9–10.3)
Chloride: 106 mmol/L (ref 98–111)
Creatinine, Ser: 1.53 mg/dL — ABNORMAL HIGH (ref 0.61–1.24)
GFR, Estimated: 46 mL/min — ABNORMAL LOW (ref >60)
Glucose, Bld: 181 mg/dL — ABNORMAL HIGH (ref 70–99)
Potassium: 4.8 mmol/L (ref 3.5–5.1)
Sodium: 139 mmol/L (ref 135–145)
Total Bilirubin: 0.6 mg/dL (ref 0.3–1.2)
Total Protein: 7.5 g/dL (ref 6.5–8.1)

## 2022-06-28 NOTE — Progress Notes (Signed)
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 Baseline pet imaging in October showed no enlarged or hypermetabolic lymph nodes to suggest recurrent CLL.  Stable emphysematous changes and pulmonary scarring.  Stable vascular disease.  Labs from today showed leukocytosis with white blood cell count of 18,700, differential pending. He only complains of mild fatigue, otherwise no major issues. No concerns on exam. No palpable lymphadenopathy or hepatosplenomegaly. At this time, there is no clear indication for treatment.  IGHV mutation and FISH panel to be done today Discussed sleep hygiene today once again. He will RTC in 3/4 months for recommendations  HISTORY OF PRESENTING ILLNESS:   JERI SEEVER 82 y.o. male is here because of lymphocytosis.  This is a very pleasant 82 year old male patient with past medical history significant for diabetes, hypertension, arthritis, benign prostatic hyperplasia, GERD related issues referred to hematology for progressive lymphocytosis and leukocytosis.    Interval History  Mr Gronemeyer is here for a follow up. He denies any new complaints today No B symptoms except for mild fatigue. He was on doxycycline for about 7 days for a possible URI. No change in breathing, bowel habits or urinary habits  Rest of the pertinent 10 point ROS reviewed and negative.  MEDICAL HISTORY:  Past Medical History:  Diagnosis Date   Arthritis    BPH (benign prostatic hypertrophy)    Bronchospasm    Chronic sinusitis    DDD (degenerative disc disease), lumbar    Esophageal stricture    GERD  (gastroesophageal reflux disease)    Heart disease    HLD (hyperlipidemia)    HTN (hypertension)    Kidney stones    Snoring     SURGICAL HISTORY: Past Surgical History:  Procedure Laterality Date   CATARACT EXTRACTION Bilateral    COLONOSCOPY     ESOPHAGEAL DILATION  10/02   ESOPHAGOGASTRODUODENOSCOPY (EGD) WITH PROPOFOL N/A 01/16/2015   Procedure: ESOPHAGOGASTRODUODENOSCOPY (EGD) WITH PROPOFOL;  Surgeon: Carman Ching, MD;  Location: [facility] ENDOSCOPY;  Service: Endoscopy;  Laterality: N/A;   HEEL SPUR SURGERY  4/02   NASAL SINUS SURGERY     SAVORY DILATION N/A 01/16/2015   Procedure: SAVORY DILATION;  Surgeon: Carman Ching, MD;  Location: [facility] ENDOSCOPY;  Service: Endoscopy;  Laterality: N/A;   TRANSURETHRAL RESECTION OF PROSTATE  6/02    SOCIAL HISTORY: Social History   Socioeconomic History   Marital status: Married    Spouse name: Not on file   Number of children: 3   Years of education: some college   Highest education level: Not on file  Occupational History   Occupation: Surveyor, minerals - [employer]  Tobacco Use   Smoking status: Former    Packs/day: 0.50    Years: 35.00    Additional pack years: 0.00    Total pack years: 17.50    Types: Cigarettes    Quit date: 01/07/2002    Years since quitting: 20.4   Smokeless tobacco: Former  Building services engineer Use: Never used  Substance and Sexual Activity   Alcohol use: Yes    Alcohol/week: 2.0 standard drinks of alcohol    Types: 2 Shots of liquor per week  Drug use: Never   Sexual activity: Yes  Other Topics Concern   Not on file  Social History Narrative   Patient lives at home with wife.   Right-handed.   Rare caffeine use.   Social Determinants of Health   Financial Resource Strain: Not on file  Food Insecurity: Not on file  Transportation Needs: Not on file  Physical Activity: Not on file  Stress: Not on file  Social Connections: Not on file  Intimate Partner Violence: Not on file    FAMILY  HISTORY: Family History  Adopted: Yes  Problem Relation Age of Onset   Sleep apnea Neg Hx    Alzheimer's disease Neg Hx     ALLERGIES:  has No Known Allergies.  MEDICATIONS:  Current Outpatient Medications  Medication Sig Dispense Refill   albuterol (VENTOLIN HFA) 108 (90 Base) MCG/ACT inhaler Inhale 2 puffs into the lungs every 6 (six) hours as needed for wheezing or shortness of breath.     aspirin 81 MG tablet Take 81 mg by mouth at bedtime.     atorvastatin (LIPITOR) 80 MG tablet Take 80 mg by mouth every evening.     donepezil (ARICEPT) 10 MG tablet Take 10 mg by mouth at bedtime.     finasteride (PROSCAR) 5 MG tablet Take 5 mg by mouth 2 (two) times a day.      fluticasone (FLONASE) 50 MCG/ACT nasal spray Place 2 sprays into both nostrils daily as needed for allergies.      losartan (COZAAR) 100 MG tablet Take 100 mg by mouth at bedtime.     metFORMIN (GLUCOPHAGE-XR) 500 MG 24 hr tablet Take 1 tablet by mouth 2 (two) times daily.     niacin (NIASPAN) 1000 MG CR tablet Take 1,000 mg by mouth at bedtime.     Omega-3 Fatty Acids (FISH OIL) 1000 MG CAPS Take 2,000 mg by mouth daily.      omeprazole (PRILOSEC OTC) 20 MG tablet Take 20 mg by mouth daily.     vitamin C (ASCORBIC ACID) 500 MG tablet Take 500 mg by mouth daily.     Zinc 30 MG TABS Take 1 tablet by mouth daily.     No current facility-administered medications for this visit.     PHYSICAL EXAMINATION: ECOG PERFORMANCE STATUS: 0 - Asymptomatic  Physical Exam Constitutional:      General: He is not in acute distress.    Appearance: Normal appearance.  Cardiovascular:     Rate and Rhythm: Normal rate and regular rhythm.  Pulmonary:     Effort: Pulmonary effort is normal.     Breath sounds: Normal breath sounds.  Abdominal:     General: There is no distension.     Palpations: There is no mass.  Musculoskeletal:        General: No swelling. Normal range of motion.  Lymphadenopathy:     Cervical: No cervical  adenopathy.  Neurological:     Mental Status: He is alert.      LABORATORY DATA:  I have reviewed the data as listed Lab Results  Component Value Date   WBC 18.7 (H) 06/28/2022   HGB 13.9 06/28/2022   HCT 44.8 06/28/2022   MCV 85.2 06/28/2022   PLT 212 06/28/2022     Chemistry      Component Value Date/Time   NA 141 02/18/2022 1123   K 4.6 02/18/2022 1123   CL 109 02/18/2022 1123   CO2 26 02/18/2022 1123   BUN 17 02/18/2022  1123   CREATININE 1.60 (H) 02/18/2022 1123      Component Value Date/Time   CALCIUM 10.2 02/18/2022 1123   ALKPHOS 82 02/18/2022 1123   AST 17 02/18/2022 1123   ALT 22 02/18/2022 1123   BILITOT 0.5 02/18/2022 1123      RADIOGRAPHIC STUDIES: I have personally reviewed the radiological images as listed and agreed with the findings in the report. No results found.  All questions were answered. The patient knows to call the clinic with any problems, questions or concerns. I spent 20 minutes in the care of this patient including H, review of records, counseling and coordination of care.     Rachel Moulds, MD 06/28/2022 8:59 AM

## 2022-07-12 ENCOUNTER — Encounter: Payer: Self-pay | Admitting: Hematology and Oncology

## 2022-07-14 LAB — IGVH SOMATIC HYPERMUTATION

## 2022-07-14 LAB — FISH,CLL PROGNOSTIC PANEL

## 2022-07-26 ENCOUNTER — Encounter: Payer: Self-pay | Admitting: Hematology and Oncology

## 2022-08-08 ENCOUNTER — Other Ambulatory Visit (HOSPITAL_COMMUNITY): Payer: Self-pay

## 2022-09-27 ENCOUNTER — Telehealth: Payer: Self-pay

## 2022-09-27 NOTE — Telephone Encounter (Signed)
We received a new referral for this patient for memory concerns. He was previously seen by Dr Roda Shutters in 2015 and by Dr Dohmeier in the sleep lab. I spoke with his wife and she advised they no longer wish to follow with [place] for sleep and are wanting to see Dr Marjory Lies for his memory concerns.  Please advise if this is ok to schedule. Thanks!

## 2022-10-03 ENCOUNTER — Other Ambulatory Visit: Payer: Self-pay | Admitting: *Deleted

## 2022-10-03 DIAGNOSIS — C9110 Chronic lymphocytic leukemia of B-cell type not having achieved remission: Secondary | ICD-10-CM

## 2022-10-03 DIAGNOSIS — D72820 Lymphocytosis (symptomatic): Secondary | ICD-10-CM

## 2022-10-04 ENCOUNTER — Inpatient Hospital Stay: Payer: Medicare Other

## 2022-10-04 ENCOUNTER — Inpatient Hospital Stay: Payer: Medicare Other | Attending: Hematology and Oncology | Admitting: Hematology and Oncology

## 2022-10-04 VITALS — BP 117/66 | HR 88 | Temp 97.9°F | Resp 16 | Wt 221.4 lb

## 2022-10-04 DIAGNOSIS — D649 Anemia, unspecified: Secondary | ICD-10-CM | POA: Insufficient documentation

## 2022-10-04 DIAGNOSIS — D72820 Lymphocytosis (symptomatic): Secondary | ICD-10-CM

## 2022-10-04 DIAGNOSIS — C9110 Chronic lymphocytic leukemia of B-cell type not having achieved remission: Secondary | ICD-10-CM

## 2022-10-04 LAB — CBC WITH DIFFERENTIAL (CANCER CENTER ONLY)
Abs Immature Granulocytes: 0.03 10*3/uL (ref 0.00–0.07)
Basophils Absolute: 0.1 10*3/uL (ref 0.0–0.1)
Basophils Relative: 1 %
Eosinophils Absolute: 0.2 10*3/uL (ref 0.0–0.5)
Eosinophils Relative: 1 %
HCT: 39.9 % (ref 39.0–52.0)
Hemoglobin: 12.4 g/dL — ABNORMAL LOW (ref 13.0–17.0)
Immature Granulocytes: 0 %
Lymphocytes Relative: 43 %
Lymphs Abs: 5.6 10*3/uL — ABNORMAL HIGH (ref 0.7–4.0)
MCH: 25.7 pg — ABNORMAL LOW (ref 26.0–34.0)
MCHC: 31.1 g/dL (ref 30.0–36.0)
MCV: 82.8 fL (ref 80.0–100.0)
Monocytes Absolute: 0.7 10*3/uL (ref 0.1–1.0)
Monocytes Relative: 6 %
Neutro Abs: 6.3 10*3/uL (ref 1.7–7.7)
Neutrophils Relative %: 49 %
Platelet Count: 239 10*3/uL (ref 150–400)
RBC: 4.82 MIL/uL (ref 4.22–5.81)
RDW: 16 % — ABNORMAL HIGH (ref 11.5–15.5)
Smear Review: NORMAL
WBC Count: 12.9 10*3/uL — ABNORMAL HIGH (ref 4.0–10.5)
nRBC: 0 % (ref 0.0–0.2)

## 2022-10-04 LAB — CMP (CANCER CENTER ONLY)
ALT: 13 U/L (ref 0–44)
AST: 12 U/L — ABNORMAL LOW (ref 15–41)
Albumin: 4.2 g/dL (ref 3.5–5.0)
Alkaline Phosphatase: 54 U/L (ref 38–126)
Anion gap: 11 (ref 5–15)
BUN: 22 mg/dL (ref 8–23)
CO2: 21 mmol/L — ABNORMAL LOW (ref 22–32)
Calcium: 9.7 mg/dL (ref 8.9–10.3)
Chloride: 102 mmol/L (ref 98–111)
Creatinine: 1.67 mg/dL — ABNORMAL HIGH (ref 0.61–1.24)
GFR, Estimated: 41 mL/min — ABNORMAL LOW (ref >60)
Glucose, Bld: 228 mg/dL — ABNORMAL HIGH (ref 70–99)
Potassium: 4.1 mmol/L (ref 3.5–5.1)
Sodium: 134 mmol/L — ABNORMAL LOW (ref 135–145)
Total Bilirubin: 0.7 mg/dL (ref 0.3–1.2)
Total Protein: 7.9 g/dL (ref 6.5–8.1)

## 2022-10-04 NOTE — Progress Notes (Signed)
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  Labs from today once again with mild leukocytosis and lymphocytosis.  Mild anemia noted, no thrombocytopenia No concerns on exam. No palpable lymphadenopathy or hepatosplenomegaly. At this time, there is no clear indication for treatment.  He will RTC in 3/4 months for recommendations With regards to the back pain, he apparently uses some Medrol Dosepak from time to time for management of the pain, he was encouraged to use this again especially since he is anticipating a trip to [place] soon.  He also will call his primary care physician if this continues to be an issue.   HISTORY OF PRESENTING ILLNESS:   Todd Cummings 82 y.o. male is here because of lymphocytosis.  This is a very pleasant 82 year old male patient with past medical history significant for diabetes, hypertension, arthritis, benign prostatic hyperplasia, GERD related issues referred to hematology for progressive lymphocytosis and leukocytosis.    Interval History  [name] is here for a follow up with his wife. Since his last visit here, he apparently had a fall on a fishing trip and has hurt his back.  He cites his fatigue and back pain which is still recovering, he denies any major issues.  No B symptoms noted.  No interim infections or hospitalizations No change in breathing, bowel habits or urinary habits  Rest of the pertinent 10 point ROS reviewed and negative.  MEDICAL HISTORY:  Past Medical History:  Diagnosis Date   Arthritis    BPH (benign prostatic  hypertrophy)    Bronchospasm    Chronic sinusitis    DDD (degenerative disc disease), lumbar    Esophageal stricture    GERD (gastroesophageal reflux disease)    Heart disease    HLD (hyperlipidemia)    HTN (hypertension)    Kidney stones    Snoring     SURGICAL HISTORY: Past Surgical History:  Procedure Laterality Date   CATARACT EXTRACTION Bilateral    COLONOSCOPY     ESOPHAGEAL DILATION  10/02   ESOPHAGOGASTRODUODENOSCOPY (EGD) WITH PROPOFOL N/A 01/16/2015   Procedure: ESOPHAGOGASTRODUODENOSCOPY (EGD) WITH PROPOFOL;  Surgeon: Carman Ching, MD;  Location: [facility] ENDOSCOPY;  Service: Endoscopy;  Laterality: N/A;   HEEL SPUR SURGERY  4/02   NASAL SINUS SURGERY     SAVORY DILATION N/A 01/16/2015   Procedure: SAVORY DILATION;  Surgeon: Carman Ching, MD;  Location: [facility] ENDOSCOPY;  Service: Endoscopy;  Laterality: N/A;   TRANSURETHRAL RESECTION OF PROSTATE  6/02    SOCIAL HISTORY: Social History   Socioeconomic History   Marital status: Married    Spouse name: Not on file   Number of children: 3   Years of education: some college   Highest education level: Not on file  Occupational History   Occupation: Surveyor, minerals - [employer]  Tobacco Use   Smoking status: Former    Current packs/day: 0.00    Average packs/day: 0.5 packs/day for 35.0 years (17.5 ttl pk-yrs)    Types: Cigarettes    Start date: 01/08/1967    Quit date: 01/07/2002    Years since quitting: 20.7   Smokeless tobacco: Former  Vaping Use   Vaping status: Never Used  Substance and Sexual Activity   Alcohol use: Yes    Alcohol/week: 2.0 standard drinks of alcohol    Types: 2 Shots of liquor per week   Drug use: Never   Sexual activity: Yes  Other Topics Concern   Not on file  Social History Narrative   Patient lives at home with wife.   Right-handed.   Rare caffeine use.   Social Determinants of Health   Financial Resource Strain: Not on file  Food Insecurity: Not on file  Transportation Needs: Not on  file  Physical Activity: Not on file  Stress: Not on file  Social Connections: Not on file  Intimate Partner Violence: Not on file    FAMILY HISTORY: Family History  Adopted: Yes  Problem Relation Age of Onset   Sleep apnea Neg Hx    Alzheimer's disease Neg Hx     ALLERGIES:  has No Known Allergies.  MEDICATIONS:  Current Outpatient Medications  Medication Sig Dispense Refill   albuterol (VENTOLIN HFA) 108 (90 Base) MCG/ACT inhaler Inhale 2 puffs into the lungs every 6 (six) hours as needed for wheezing or shortness of breath.     aspirin 81 MG tablet Take 81 mg by mouth at bedtime.     atorvastatin (LIPITOR) 80 MG tablet Take 80 mg by mouth every evening.     dapagliflozin propanediol (FARXIGA) 10 MG TABS tablet Take 10 mg by mouth daily.     donepezil (ARICEPT) 10 MG tablet Take 10 mg by mouth at bedtime.     finasteride (PROSCAR) 5 MG tablet Take 5 mg by mouth 2 (two) times a day.      fluticasone (FLONASE) 50 MCG/ACT nasal spray Place 2 sprays into both nostrils daily as needed for allergies.      losartan (COZAAR) 100 MG tablet Take 100 mg by mouth at bedtime.     metFORMIN (GLUCOPHAGE-XR) 500 MG 24 hr tablet Take 1 tablet by mouth 2 (two) times daily.     niacin (NIASPAN) 1000 MG CR tablet Take 1,000 mg by mouth at bedtime.     Omega-3 Fatty Acids (FISH OIL) 1000 MG CAPS Take 2,000 mg by mouth daily.      omeprazole (PRILOSEC OTC) 20 MG tablet Take 20 mg by mouth daily.     vitamin C (ASCORBIC ACID) 500 MG tablet Take 500 mg by mouth daily.     Zinc 30 MG TABS Take 1 tablet by mouth daily.     No current facility-administered medications for this visit.     PHYSICAL EXAMINATION: ECOG PERFORMANCE STATUS: 0 - Asymptomatic  Physical Exam Constitutional:      General: He is not in acute distress.    Appearance: Normal appearance.  Cardiovascular:     Rate and Rhythm: Normal rate and regular rhythm.  Pulmonary:     Effort: Pulmonary effort is normal.     Breath  sounds: Normal breath sounds.  Abdominal:     General: There is no distension.     Palpations: There is no mass.  Musculoskeletal:        General: No swelling. Normal range of motion.  Lymphadenopathy:     Cervical: No cervical adenopathy.  Neurological:     Mental Status: He is alert.      LABORATORY DATA:  I have reviewed the data as listed Lab Results  Component Value Date   WBC 12.9 (H) 10/04/2022   HGB 12.4 (L) 10/04/2022  HCT 39.9 10/04/2022   MCV 82.8 10/04/2022   PLT 239 10/04/2022     Chemistry      Component Value Date/Time   NA 139 06/28/2022 0830   K 4.8 06/28/2022 0830   CL 106 06/28/2022 0830   CO2 25 06/28/2022 0830   BUN 17 06/28/2022 0830   CREATININE 1.53 (H) 06/28/2022 0830      Component Value Date/Time   CALCIUM 9.4 06/28/2022 0830   ALKPHOS 83 06/28/2022 0830   AST 21 06/28/2022 0830   ALT 23 06/28/2022 0830   BILITOT 0.6 06/28/2022 0830      RADIOGRAPHIC STUDIES: I have personally reviewed the radiological images as listed and agreed with the findings in the report. No results found.  All questions were answered. The patient knows to call the clinic with any problems, questions or concerns. I spent 20 minutes in the care of this patient including H, review of records, counseling and coordination of care.     Rachel Moulds, MD 10/04/2022 11:33 AM

## 2022-10-05 LAB — BETA-2-GLYCOPROTEIN I ABS, IGG/M/A
Beta-2 Glyco I IgG: <9 GPI IgG units (ref 0–20)
Beta-2-Glycoprotein I IgA: <9 GPI IgA units (ref 0–25)
Beta-2-Glycoprotein I IgM: <9 GPI IgM units (ref 0–32)

## 2022-10-11 ENCOUNTER — Other Ambulatory Visit (HOSPITAL_BASED_OUTPATIENT_CLINIC_OR_DEPARTMENT_OTHER): Payer: Self-pay

## 2022-10-11 ENCOUNTER — Other Ambulatory Visit (HOSPITAL_COMMUNITY): Payer: Self-pay

## 2022-10-11 MED ORDER — LOSARTAN POTASSIUM 100 MG PO TABS
100.0000 mg | ORAL_TABLET | Freq: Every day | ORAL | 3 refills | Status: DC
  Filled 2022-10-11: qty 90, 90d supply, fill #0
  Filled 2023-01-04: qty 90, 90d supply, fill #1
  Filled 2023-04-19: qty 90, 90d supply, fill #2
  Filled 2023-07-19: qty 90, 90d supply, fill #3

## 2022-10-11 MED ORDER — FINASTERIDE 5 MG PO TABS
5.0000 mg | ORAL_TABLET | Freq: Every day | ORAL | 3 refills | Status: DC
  Filled 2022-10-11: qty 90, 90d supply, fill #0
  Filled 2023-01-04: qty 90, 90d supply, fill #1
  Filled 2023-04-11: qty 90, 90d supply, fill #2
  Filled 2023-07-10: qty 90, 90d supply, fill #3

## 2022-10-11 MED ORDER — DONEPEZIL HCL 10 MG PO TABS
10.0000 mg | ORAL_TABLET | Freq: Every day | ORAL | 3 refills | Status: DC
  Filled 2022-10-11: qty 90, 90d supply, fill #0
  Filled 2023-01-04: qty 90, 90d supply, fill #1
  Filled 2023-05-06: qty 90, 90d supply, fill #2
  Filled 2023-08-05: qty 90, 90d supply, fill #3

## 2022-10-12 ENCOUNTER — Other Ambulatory Visit (HOSPITAL_COMMUNITY): Payer: Self-pay

## 2022-10-12 MED ORDER — PAXLOVID (150/100) 10 X 150 MG & 10 X 100MG PO TBPK
ORAL_TABLET | ORAL | 0 refills | Status: DC
  Filled 2022-10-12: qty 20, 5d supply, fill #0

## 2022-10-13 ENCOUNTER — Other Ambulatory Visit (HOSPITAL_COMMUNITY): Payer: Self-pay

## 2022-11-11 ENCOUNTER — Other Ambulatory Visit (HOSPITAL_COMMUNITY): Payer: Self-pay

## 2022-11-11 MED ORDER — ALBUTEROL SULFATE HFA 108 (90 BASE) MCG/ACT IN AERS
2.0000 | INHALATION_SPRAY | RESPIRATORY_TRACT | 3 refills | Status: AC | PRN
  Filled 2022-11-11: qty 6.7, 30d supply, fill #0

## 2022-11-11 MED ORDER — METFORMIN HCL ER 500 MG PO TB24
500.0000 mg | ORAL_TABLET | Freq: Two times a day (BID) | ORAL | 3 refills | Status: DC
  Filled 2022-11-11: qty 180, 90d supply, fill #0
  Filled 2023-06-27: qty 180, 90d supply, fill #2
  Filled 2023-10-01: qty 180, 90d supply, fill #3

## 2022-12-05 ENCOUNTER — Telehealth: Payer: Self-pay | Admitting: Diagnostic Neuroimaging

## 2022-12-05 NOTE — Telephone Encounter (Signed)
LVM and sent mychart msg informing pt of need to reschedule 01/03/23 appt - MD out

## 2022-12-28 ENCOUNTER — Other Ambulatory Visit (HOSPITAL_COMMUNITY): Payer: Self-pay

## 2022-12-28 MED ORDER — OMEPRAZOLE 20 MG PO CPDR
20.0000 mg | DELAYED_RELEASE_CAPSULE | Freq: Every day | ORAL | 3 refills | Status: DC
  Filled 2022-12-28: qty 90, 90d supply, fill #0
  Filled 2023-04-23: qty 90, 90d supply, fill #1
  Filled 2023-07-24: qty 90, 90d supply, fill #2
  Filled 2023-10-25: qty 90, 90d supply, fill #3

## 2022-12-28 MED ORDER — ATORVASTATIN CALCIUM 20 MG PO TABS
20.0000 mg | ORAL_TABLET | Freq: Every day | ORAL | 3 refills | Status: DC
  Filled 2022-12-28: qty 90, 90d supply, fill #0
  Filled 2023-04-11: qty 90, 90d supply, fill #1
  Filled 2023-07-10: qty 90, 90d supply, fill #2
  Filled 2023-10-07: qty 90, 90d supply, fill #3

## 2022-12-28 MED ORDER — DAPAGLIFLOZIN PROPANEDIOL 10 MG PO TABS
10.0000 mg | ORAL_TABLET | Freq: Every day | ORAL | 3 refills | Status: DC
  Filled 2022-12-28 – 2023-02-09 (×2): qty 90, 90d supply, fill #0
  Filled 2023-05-30: qty 30, 30d supply, fill #0
  Filled 2023-06-27: qty 30, 30d supply, fill #1
  Filled 2023-07-24: qty 30, 30d supply, fill #2
  Filled 2023-08-31: qty 30, 30d supply, fill #3
  Filled 2023-10-01: qty 30, 30d supply, fill #4
  Filled 2023-10-28: qty 30, 30d supply, fill #5
  Filled 2023-11-28: qty 30, 30d supply, fill #6
  Filled 2023-12-26: qty 30, 30d supply, fill #7

## 2022-12-30 ENCOUNTER — Other Ambulatory Visit (HOSPITAL_COMMUNITY): Payer: Self-pay

## 2023-01-03 ENCOUNTER — Ambulatory Visit: Payer: Medicare Other | Admitting: Diagnostic Neuroimaging

## 2023-01-06 ENCOUNTER — Other Ambulatory Visit (HOSPITAL_COMMUNITY): Payer: Self-pay

## 2023-01-09 ENCOUNTER — Other Ambulatory Visit: Payer: Self-pay

## 2023-01-09 DIAGNOSIS — C9110 Chronic lymphocytic leukemia of B-cell type not having achieved remission: Secondary | ICD-10-CM

## 2023-01-10 ENCOUNTER — Inpatient Hospital Stay (HOSPITAL_BASED_OUTPATIENT_CLINIC_OR_DEPARTMENT_OTHER): Payer: Medicare Other | Admitting: Hematology and Oncology

## 2023-01-10 ENCOUNTER — Inpatient Hospital Stay: Payer: Medicare Other | Attending: Hematology and Oncology

## 2023-01-10 VITALS — BP 125/72 | HR 64 | Temp 98.5°F | Resp 16 | Wt 224.6 lb

## 2023-01-10 DIAGNOSIS — I10 Essential (primary) hypertension: Secondary | ICD-10-CM | POA: Insufficient documentation

## 2023-01-10 DIAGNOSIS — M1990 Unspecified osteoarthritis, unspecified site: Secondary | ICD-10-CM | POA: Insufficient documentation

## 2023-01-10 DIAGNOSIS — K219 Gastro-esophageal reflux disease without esophagitis: Secondary | ICD-10-CM | POA: Diagnosis not present

## 2023-01-10 DIAGNOSIS — C9110 Chronic lymphocytic leukemia of B-cell type not having achieved remission: Secondary | ICD-10-CM | POA: Diagnosis not present

## 2023-01-10 DIAGNOSIS — N400 Enlarged prostate without lower urinary tract symptoms: Secondary | ICD-10-CM | POA: Diagnosis not present

## 2023-01-10 DIAGNOSIS — E119 Type 2 diabetes mellitus without complications: Secondary | ICD-10-CM | POA: Insufficient documentation

## 2023-01-10 LAB — CBC WITH DIFFERENTIAL (CANCER CENTER ONLY)
Abs Immature Granulocytes: 0.03 10*3/uL (ref 0.00–0.07)
Basophils Absolute: 0.2 10*3/uL — ABNORMAL HIGH (ref 0.0–0.1)
Basophils Relative: 1 %
Eosinophils Absolute: 0.6 10*3/uL — ABNORMAL HIGH (ref 0.0–0.5)
Eosinophils Relative: 5 %
HCT: 42.9 % (ref 39.0–52.0)
Hemoglobin: 12.8 g/dL — ABNORMAL LOW (ref 13.0–17.0)
Immature Granulocytes: 0 %
Lymphocytes Relative: 63 %
Lymphs Abs: 9 10*3/uL — ABNORMAL HIGH (ref 0.7–4.0)
MCH: 24.9 pg — ABNORMAL LOW (ref 26.0–34.0)
MCHC: 29.8 g/dL — ABNORMAL LOW (ref 30.0–36.0)
MCV: 83.3 fL (ref 80.0–100.0)
Monocytes Absolute: 0.7 10*3/uL (ref 0.1–1.0)
Monocytes Relative: 5 %
Neutro Abs: 3.7 10*3/uL (ref 1.7–7.7)
Neutrophils Relative %: 26 %
Platelet Count: 221 10*3/uL (ref 150–400)
RBC: 5.15 MIL/uL (ref 4.22–5.81)
RDW: 16.2 % — ABNORMAL HIGH (ref 11.5–15.5)
Smear Review: NORMAL
WBC Count: 14.2 10*3/uL — ABNORMAL HIGH (ref 4.0–10.5)
nRBC: 0 % (ref 0.0–0.2)

## 2023-01-10 LAB — CMP (CANCER CENTER ONLY)
ALT: 18 U/L (ref 0–44)
AST: 17 U/L (ref 15–41)
Albumin: 4.6 g/dL (ref 3.5–5.0)
Alkaline Phosphatase: 69 U/L (ref 38–126)
Anion gap: 8 (ref 5–15)
BUN: 13 mg/dL (ref 8–23)
CO2: 27 mmol/L (ref 22–32)
Calcium: 9.8 mg/dL (ref 8.9–10.3)
Chloride: 105 mmol/L (ref 98–111)
Creatinine: 1.45 mg/dL — ABNORMAL HIGH (ref 0.61–1.24)
GFR, Estimated: 49 mL/min — ABNORMAL LOW (ref >60)
Glucose, Bld: 157 mg/dL — ABNORMAL HIGH (ref 70–99)
Potassium: 4.6 mmol/L (ref 3.5–5.1)
Sodium: 140 mmol/L (ref 135–145)
Total Bilirubin: 0.5 mg/dL (ref <1.2)
Total Protein: 7.4 g/dL (ref 6.5–8.1)

## 2023-01-10 NOTE — Progress Notes (Signed)
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 No symptoms of infection, fever, or drenching sweats. -Continue current management and monitoring. -Return for follow-up and labs in four months.  Weight Loss Minimal weight loss noted over several years, not significant. -Monitor weight.  General Health Maintenance -Continue current lifestyle and medications. -Plan for future travel discussed, no medical concerns raised.   HISTORY OF PRESENTING ILLNESS:   Todd Cummings 82 y.o. male is here because of lymphocytosis.  This is a very pleasant 82 year old male patient with past medical history significant for diabetes, hypertension, arthritis, benign prostatic hyperplasia, GERD related issues referred to hematology for progressive lymphocytosis and leukocytosis.    Discussed the use of AI scribe software for clinical note transcription with the patient, who gave verbal consent to proceed.  History of Present Illness    The patient, accompanied by his wife, reports no significant changes in his health since the last visit. He has been experiencing back pain, which seems to have improved recently. The patient's wife notes that when the patient whistles in the morning, it's an indication that his back pain is not as severe.   The patient and his wife recently went on a trip to [place], which he enjoyed. During the trip, the patient had to take some prednisone for his back pain. The patient's wife also took prednisone for her knees. They both took a Dosepak due to the anticipated physical strain from the trip.  The patient's wife notes  that the patient may have lost some weight since he first started seeing the doctor. The patient's weight was 224 pounds at the current visit. The patient's wife believes the patient was around 240 pounds when he first started seeing the doctor.  The patient and his wife are planning a trip to [place] next year.  No other B symptoms, recent infections. Rest of the pertinent 10 point ROS reviewed and neg.  MEDICAL HISTORY:  Past Medical History:  Diagnosis Date   Arthritis    BPH (benign prostatic hypertrophy)    Bronchospasm    Chronic sinusitis    DDD (degenerative disc disease), lumbar    Esophageal stricture    GERD (gastroesophageal reflux disease)    Heart disease    HLD (hyperlipidemia)    HTN (hypertension)    Kidney stones    Snoring     SURGICAL HISTORY: Past Surgical History:  Procedure Laterality Date   CATARACT EXTRACTION Bilateral    COLONOSCOPY     ESOPHAGEAL DILATION  10/02   ESOPHAGOGASTRODUODENOSCOPY (EGD) WITH PROPOFOL N/A 01/16/2015   Procedure: ESOPHAGOGASTRODUODENOSCOPY (EGD) WITH PROPOFOL;  Surgeon: Carman Ching, MD;  Location: [facility] ENDOSCOPY;  Service: Endoscopy;  Laterality: N/A;   HEEL SPUR SURGERY  4/02   NASAL SINUS SURGERY     SAVORY DILATION N/A 01/16/2015   Procedure: SAVORY DILATION;  Surgeon: Carman Ching, MD;  Location: [facility] ENDOSCOPY;  Service: Endoscopy;  Laterality: N/A;   TRANSURETHRAL RESECTION OF PROSTATE  6/02    SOCIAL HISTORY: Social History   Socioeconomic History   Marital status: Married    Spouse name: Not on file   Number of children: 3   Years  of education: some college   Highest education level: Not on file  Occupational History   Occupation: Surveyor, minerals - [employer]  Tobacco Use   Smoking status: Former    Current packs/day: 0.00    Average packs/day: 0.5 packs/day for 35.0 years (17.5 ttl pk-yrs)    Types: Cigarettes    Start date: 01/08/1967    Quit date: 01/07/2002    Years since quitting: 21.0   Smokeless  tobacco: Former  Building services engineer status: Never Used  Substance and Sexual Activity   Alcohol use: Yes    Alcohol/week: 2.0 standard drinks of alcohol    Types: 2 Shots of liquor per week   Drug use: Never   Sexual activity: Yes  Other Topics Concern   Not on file  Social History Narrative   Patient lives at home with wife.   Right-handed.   Rare caffeine use.   Social Determinants of Health   Financial Resource Strain: Not on file  Food Insecurity: Not on file  Transportation Needs: Not on file  Physical Activity: Not on file  Stress: Not on file  Social Connections: Not on file  Intimate Partner Violence: Not on file    FAMILY HISTORY: Family History  Adopted: Yes  Problem Relation Age of Onset   Sleep apnea Neg Hx    Alzheimer's disease Neg Hx     ALLERGIES:  has No Known Allergies.  MEDICATIONS:  Current Outpatient Medications  Medication Sig Dispense Refill   albuterol (VENTOLIN HFA) 108 (90 Base) MCG/ACT inhaler Inhale 2 puffs into the lungs every 6 (six) hours as needed for wheezing or shortness of breath.     albuterol (VENTOLIN HFA) 108 (90 Base) MCG/ACT inhaler Inhale 2 puffs into the lungs every 4 (four) hours as needed for wheezing, cough, or shortness of breath. 6.7 g 3   aspirin 81 MG tablet Take 81 mg by mouth at bedtime.     atorvastatin (LIPITOR) 20 MG tablet Take 1 tablet (20 mg total) by mouth daily. 90 tablet 3   atorvastatin (LIPITOR) 80 MG tablet Take 80 mg by mouth every evening.     dapagliflozin propanediol (FARXIGA) 10 MG TABS tablet Take 10 mg by mouth daily.     dapagliflozin propanediol (FARXIGA) 10 MG TABS tablet Take 1 tablet (10 mg total) by mouth daily. 90 tablet 3   donepezil (ARICEPT) 10 MG tablet Take 10 mg by mouth at bedtime.     donepezil (ARICEPT) 10 MG tablet Take 1 tablet (10 mg total) by mouth at bedtime. 90 tablet 3   finasteride (PROSCAR) 5 MG tablet Take 5 mg by mouth 2 (two) times a day.      finasteride (PROSCAR) 5  MG tablet Take 1 tablet (5 mg total) by mouth daily. 90 tablet 3   fluticasone (FLONASE) 50 MCG/ACT nasal spray Place 2 sprays into both nostrils daily as needed for allergies.      losartan (COZAAR) 100 MG tablet Take 100 mg by mouth at bedtime.     losartan (COZAAR) 100 MG tablet Take 1 tablet (100 mg total) by mouth at bedtime. 90 tablet 3   metFORMIN (GLUCOPHAGE-XR) 500 MG 24 hr tablet Take 1 tablet by mouth 2 (two) times daily.     metFORMIN (GLUCOPHAGE-XR) 500 MG 24 hr tablet Take 1 tablet (500 mg total) by mouth 2 (two) times daily with food. 180 tablet 3   niacin (NIASPAN) 1000 MG CR tablet Take 1,000 mg by mouth at  bedtime.     nirmatrelvir & ritonavir (PAXLOVID, 150/100,) 10 x 150 MG & 10 x 100MG  TBPK Take as directed on package 20 tablet 0   Omega-3 Fatty Acids (FISH OIL) 1000 MG CAPS Take 2,000 mg by mouth daily.      omeprazole (PRILOSEC OTC) 20 MG tablet Take 20 mg by mouth daily.     omeprazole (PRILOSEC) 20 MG capsule Take 1 capsule (20 mg total) by mouth daily. 90 capsule 3   vitamin C (ASCORBIC ACID) 500 MG tablet Take 500 mg by mouth daily.     Zinc 30 MG TABS Take 1 tablet by mouth daily.     No current facility-administered medications for this visit.    PHYSICAL EXAMINATION: ECOG PERFORMANCE STATUS: 0 - Asymptomatic  Physical Exam Constitutional:      General: He is not in acute distress.    Appearance: Normal appearance.  Cardiovascular:     Rate and Rhythm: Normal rate and regular rhythm.  Pulmonary:     Effort: Pulmonary effort is normal.     Breath sounds: Normal breath sounds.  Abdominal:     General: There is no distension.     Palpations: There is no mass.  Musculoskeletal:        General: No swelling. Normal range of motion.  Lymphadenopathy:     Cervical: No cervical adenopathy.  Neurological:     Mental Status: He is alert.      LABORATORY DATA:  I have reviewed the data as listed Lab Results  Component Value Date   WBC 14.2 (H) 01/10/2023    HGB 12.8 (L) 01/10/2023   HCT 42.9 01/10/2023   MCV 83.3 01/10/2023   PLT 221 01/10/2023     Chemistry      Component Value Date/Time   NA 134 (L) 10/04/2022 1109   K 4.1 10/04/2022 1109   CL 102 10/04/2022 1109   CO2 21 (L) 10/04/2022 1109   BUN 22 10/04/2022 1109   CREATININE 1.67 (H) 10/04/2022 1109      Component Value Date/Time   CALCIUM 9.7 10/04/2022 1109   ALKPHOS 54 10/04/2022 1109   AST 12 (L) 10/04/2022 1109   ALT 13 10/04/2022 1109   BILITOT 0.7 10/04/2022 1109      RADIOGRAPHIC STUDIES: I have personally reviewed the radiological images as listed and agreed with the findings in the report. No results found.  All questions were answered. The patient knows to call the clinic with any problems, questions or concerns. I spent 20 minutes in the care of this patient including H, review of records, counseling and coordination of care.     Rachel Moulds, MD 01/10/2023 12:31 PM

## 2023-01-24 ENCOUNTER — Encounter: Payer: Self-pay | Admitting: Diagnostic Neuroimaging

## 2023-01-24 ENCOUNTER — Ambulatory Visit: Payer: Medicare Other | Admitting: Diagnostic Neuroimaging

## 2023-01-24 VITALS — BP 121/72 | HR 67 | Ht 70.0 in | Wt 224.0 lb

## 2023-01-24 DIAGNOSIS — G3184 Mild cognitive impairment, so stated: Secondary | ICD-10-CM | POA: Diagnosis not present

## 2023-01-24 NOTE — Patient Instructions (Addendum)
  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 - aim for at least 7-8 hours sleep per night (or more) - avoid smoking and alcohol - caution with medications, finances, driving - safety / supervision issues reviewed

## 2023-01-24 NOTE — Progress Notes (Signed)
[facility]  PATIENT: Todd Cummings DOB: Jun 24, 1940  REFERRING CLINICIAN: Rodrigo Ran, MD HISTORY FROM: patient and wife REASON FOR VISIT: new consult   HISTORICAL  CHIEF COMPLAINT:  Chief Complaint  Patient presents with   New Patient (Initial Visit)    Patient in room #7 with his wife. Patient states he here to discuss his memory issues.    HISTORY OF PRESENT ILLNESS:   82 year old male here for evaluation of memory impairment.  Patient acknowledges some minor memory impairments.  This has been noticed more by his wife.  Symptoms initially presented about 4 years ago but have gradually progressed.  Now he has had some problems with recalling driving directions, names of people and other recent events.  No significant change in ADLs.  He was tried on donepezil few years ago and has tolerated this well.  Also has sleep apnea but could not tolerate CPAP.  Patient still is fairly active.  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   REVIEW OF SYSTEMS: Full 14 system review of systems performed and negative with exception of: As per HPI.  ALLERGIES: No Known Allergies  HOME MEDICATIONS: Outpatient Medications Prior to Visit  Medication Sig Dispense Refill   acetaminophen (TYLENOL) 500 MG tablet Take 500 mg by mouth every 4 (four) hours as needed.     albuterol (VENTOLIN HFA) 108 (90 Base) MCG/ACT inhaler Inhale 2 puffs into the lungs every 4 (four) hours as needed for wheezing, cough, or shortness of breath. 6.7 g 3   aspirin 81 MG tablet Take 81 mg by mouth at bedtime.     atorvastatin (LIPITOR) 20 MG tablet Take 1 tablet (20 mg total) by mouth daily. 90 tablet 3   dapagliflozin propanediol (FARXIGA) 10 MG TABS tablet Take 1 tablet (10 mg total) by mouth daily. 90 tablet 3   donepezil (ARICEPT) 10 MG tablet Take 1 tablet (10 mg total) by mouth at bedtime. 90 tablet 3   finasteride (PROSCAR) 5 MG tablet Take 1 tablet (5 mg total) by mouth daily. 90  tablet 3   fluticasone (FLONASE) 50 MCG/ACT nasal spray Place 2 sprays into both nostrils daily as needed for allergies.      losartan (COZAAR) 100 MG tablet Take 1 tablet (100 mg total) by mouth at bedtime. 90 tablet 3   metFORMIN (GLUCOPHAGE-XR) 500 MG 24 hr tablet Take 1 tablet (500 mg total) by mouth 2 (two) times daily with food. 180 tablet 3   niacin (NIASPAN) 1000 MG CR tablet Take 1,000 mg by mouth at bedtime.     Omega-3 Fatty Acids (FISH OIL) 1000 MG CAPS Take 2,000 mg by mouth daily.      omeprazole (PRILOSEC) 20 MG capsule Take 1 capsule (20 mg total) by mouth daily. 90 capsule 3   vitamin C (ASCORBIC ACID) 500 MG tablet Take 500 mg by mouth daily.     Zinc 30 MG TABS Take 1 tablet by mouth daily.     albuterol (VENTOLIN HFA) 108 (90 Base) MCG/ACT inhaler Inhale 2 puffs into the lungs every 6 (six) hours as needed for wheezing or shortness of breath. (Patient not taking: Reported on 01/24/2023)     atorvastatin (LIPITOR) 80 MG tablet Take 80 mg by mouth every evening. (Patient not taking: Reported on 01/24/2023)     dapagliflozin propanediol (FARXIGA) 10 MG TABS tablet Take 10 mg by mouth daily. (Patient not taking: Reported on 01/24/2023)     donepezil (ARICEPT)  10 MG tablet Take 10 mg by mouth at bedtime. (Patient not taking: Reported on 01/24/2023)     finasteride (PROSCAR) 5 MG tablet Take 5 mg by mouth 2 (two) times a day.  (Patient not taking: Reported on 01/24/2023)     losartan (COZAAR) 100 MG tablet Take 100 mg by mouth at bedtime. (Patient not taking: Reported on 01/24/2023)     metFORMIN (GLUCOPHAGE-XR) 500 MG 24 hr tablet Take 1 tablet by mouth 2 (two) times daily. (Patient not taking: Reported on 01/24/2023)     nirmatrelvir & ritonavir (PAXLOVID, 150/100,) 10 x 150 MG & 10 x 100MG  TBPK Take as directed on package (Patient not taking: Reported on 01/24/2023) 20 tablet 0   omeprazole (PRILOSEC OTC) 20 MG tablet Take 20 mg by mouth daily. (Patient not taking: Reported on  01/24/2023)     No facility-administered medications prior to visit.    PAST MEDICAL HISTORY: Past Medical History:  Diagnosis Date   Arthritis    BPH (benign prostatic hypertrophy)    Bronchospasm    Chronic sinusitis    DDD (degenerative disc disease), lumbar    Esophageal stricture    GERD (gastroesophageal reflux disease)    Heart disease    HLD (hyperlipidemia)    HTN (hypertension)    Kidney stones    Snoring     PAST SURGICAL HISTORY: Past Surgical History:  Procedure Laterality Date   CATARACT EXTRACTION Bilateral    COLONOSCOPY     ESOPHAGEAL DILATION  10/02   ESOPHAGOGASTRODUODENOSCOPY (EGD) WITH PROPOFOL N/A 01/16/2015   Procedure: ESOPHAGOGASTRODUODENOSCOPY (EGD) WITH PROPOFOL;  Surgeon: Carman Ching, MD;  Location: [facility] ENDOSCOPY;  Service: Endoscopy;  Laterality: N/A;   HEEL SPUR SURGERY  4/02   NASAL SINUS SURGERY     SAVORY DILATION N/A 01/16/2015   Procedure: SAVORY DILATION;  Surgeon: Carman Ching, MD;  Location: [facility] ENDOSCOPY;  Service: Endoscopy;  Laterality: N/A;   TRANSURETHRAL RESECTION OF PROSTATE  6/02    FAMILY HISTORY: Family History  Adopted: Yes  Problem Relation Age of Onset   Sleep apnea Neg Hx    Alzheimer's disease Neg Hx     SOCIAL HISTORY: Social History   Socioeconomic History   Marital status: Married    Spouse name: Not on file   Number of children: 3   Years of education: some college   Highest education level: Not on file  Occupational History   Occupation: Surveyor, 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   Right-handed.   Rare caffeine use.   Social Drivers of Corporate investment banker Strain: Not on file  Food Insecurity: Not on file  Transportation Needs: Not on file  Physical Activity: Not on file  Stress: Not on file  Social Connections: Not on file  Intimate Partner Violence: Not on file     PHYSICAL EXAM  GENERAL EXAM/CONSTITUTIONAL: Vitals:  Vitals:   01/24/23 0948  BP: 121/72  Pulse: 67  Weight: 224 lb (101.6 kg)  Height: 5\' 10"  (1.778 m)   Body mass index is 32.14 kg/m. Wt Readings from Last 3 Encounters:  01/24/23 224 lb (101.6 kg)  01/10/23 224 lb 9.6 oz (101.9 kg)  10/04/22 221 lb 6.4 oz (100.4 kg)   Patient is in no distress; well developed, nourished and groomed; neck is supple  CARDIOVASCULAR: Examination of carotid arteries is normal; no carotid bruits Regular rate and rhythm, no murmurs Examination of peripheral vascular system by observation and palpation is normal  EYES: Ophthalmoscopic exam of optic discs and posterior segments is normal; no papilledema or hemorrhages No results found.  MUSCULOSKELETAL: Gait, strength, tone, movements noted in Neurologic exam below  NEUROLOGIC: MENTAL STATUS:     01/24/2023   10:11 AM 11/16/2021    8:38 AM  MMSE - Mini Mental State Exam  Orientation to time 5 4  Orientation to Place 5 4  Registration 3 3  Attention/ Calculation 5 4  Recall 3 2  Language- name 2 objects 2 2  Language- repeat 1 1  Language- follow 3 step command 2 3  Language- read & follow direction 1 1  Write a sentence 1 1  Copy design 1 1  Total score 29 26   awake, alert, oriented to person, place and time recent and remote memory intact normal attention and concentration language fluent, comprehension intact, naming intact fund of knowledge appropriate  CRANIAL NERVE:  2nd -  no papilledema on fundoscopic exam 2nd, 3rd, 4th, 6th - pupils equal and reactive to light, visual fields full to confrontation, extraocular muscles intact, no nystagmus 5th - facial sensation symmetric 7th - facial strength symmetric 8th - hearing intact 9th - palate elevates symmetrically, uvula midline 11th - shoulder shrug symmetric 12th - tongue protrusion midline  MOTOR:  normal bulk and tone, full strength in the BUE, BLE  SENSORY:  normal and symmetric to light touch, temperature, vibration  COORDINATION:  finger-nose-finger, fine finger movements normal  REFLEXES:  deep tendon reflexes TRACE and symmetric  GAIT/STATION:  narrow based gait     DIAGNOSTIC DATA (LABS, IMAGING, TESTING) - I reviewed patient records, labs, notes, testing and imaging myself where available.  Lab Results  Component Value Date   WBC 14.2 (H) 01/10/2023   HGB 12.8 (L) 01/10/2023   HCT 42.9 01/10/2023   MCV 83.3 01/10/2023   PLT 221 01/10/2023      Component Value Date/Time   NA 140 01/10/2023 1208   K 4.6 01/10/2023 1208   CL 105 01/10/2023 1208   CO2 27 01/10/2023 1208   GLUCOSE 157 (H) 01/10/2023 1208   BUN 13 01/10/2023 1208   CREATININE 1.45 (H) 01/10/2023 1208   CALCIUM 9.8 01/10/2023 1208   PROT 7.4 01/10/2023 1208   ALBUMIN 4.6 01/10/2023 1208   AST 17 01/10/2023 1208   ALT 18 01/10/2023 1208   ALKPHOS 69 01/10/2023 1208   BILITOT 0.5 01/10/2023 1208   GFRNONAA 49 (L) 01/10/2023 1208   GFRAA 59 (L) 03/06/2019 2006   Lab Results  Component Value Date   CHOL 144 06/13/2013   HDL 48 06/13/2013   LDLCALC 73 06/13/2013   TRIG 116 06/13/2013   CHOLHDL 3.0 06/13/2013   Lab Results  Component Value Date   HGBA1C 6.0 (H) 06/12/2013   No results found  for: "VITAMINB12" No results found for: "TSH"  06/23/20 MRI brain [I reviewed images myself and agree with interpretation. -VRP]  1. Nonspecific brain atrophy, a change since 2015. 2. No reversible finding. 3.  Chronic sinusitis without progression from prior.  11/17/21 PET scan 1. No enlarged or hypermetabolic lymph nodes to suggest recurrent CLL. 2. Stable emphysematous changes and pulmonary scarring. 3. Stable vascular disease.   ASSESSMENT AND PLAN  82 y.o. year old male here with:   Dx:  1. MCI (mild cognitive impairment)     PLAN:  MILD MEMORY LOSS (mmse 29/30; mild cognitive impairment; no change in ADLs) - continue donepezil; could consider memantine - patient not interested in other testing or treatments such as amyloid targeting therapies - consider dental device for OSA - try to stay active physically and get some exercise (at least 15-30 minutes per day) - eat a nutritious diet with lean protein, plants / vegetables, whole grains; avoid ultra-processed foods - increase social activities, brain stimulation, games, puzzles, hobbies, crafts, arts, music; try new activities; keep it fun! - aim for at least 7-8 hours sleep per night (or more) - avoid smoking and alcohol - caution with medications, finances, driving - safety / supervision issues reviewed  Return for pending if symptoms worsen or fail to improve, return to PCP.    Suanne Marker, MD 01/24/2023, 10:42 AM Certified in Neurology, Neurophysiology and Neuroimaging  [facility] [address] [place], [place] [address] [phone]

## 2023-02-09 ENCOUNTER — Other Ambulatory Visit (HOSPITAL_COMMUNITY): Payer: Self-pay

## 2023-05-16 ENCOUNTER — Ambulatory Visit: Payer: Medicare Other | Admitting: Hematology and Oncology

## 2023-05-16 ENCOUNTER — Other Ambulatory Visit: Payer: Medicare Other

## 2023-05-29 ENCOUNTER — Telehealth: Payer: Self-pay

## 2023-05-29 NOTE — Telephone Encounter (Signed)
 Spoke with patient and confirmed appointment on 4/22

## 2023-05-30 ENCOUNTER — Other Ambulatory Visit (HOSPITAL_COMMUNITY): Payer: Self-pay

## 2023-05-30 ENCOUNTER — Inpatient Hospital Stay: Payer: Medicare Other | Attending: Hematology and Oncology

## 2023-05-30 ENCOUNTER — Other Ambulatory Visit: Payer: Self-pay | Admitting: *Deleted

## 2023-05-30 ENCOUNTER — Inpatient Hospital Stay: Payer: Medicare Other | Admitting: Hematology and Oncology

## 2023-05-30 ENCOUNTER — Encounter: Payer: Self-pay | Admitting: Hematology and Oncology

## 2023-05-30 VITALS — BP 135/64 | HR 61 | Temp 98.2°F | Resp 17 | Wt 227.4 lb

## 2023-05-30 DIAGNOSIS — D72820 Lymphocytosis (symptomatic): Secondary | ICD-10-CM

## 2023-05-30 DIAGNOSIS — C9110 Chronic lymphocytic leukemia of B-cell type not having achieved remission: Secondary | ICD-10-CM | POA: Diagnosis not present

## 2023-05-30 DIAGNOSIS — I10 Essential (primary) hypertension: Secondary | ICD-10-CM | POA: Insufficient documentation

## 2023-05-30 DIAGNOSIS — E119 Type 2 diabetes mellitus without complications: Secondary | ICD-10-CM | POA: Insufficient documentation

## 2023-05-30 LAB — CBC WITH DIFFERENTIAL (CANCER CENTER ONLY)
Abs Immature Granulocytes: 0.01 10*3/uL (ref 0.00–0.07)
Basophils Absolute: 0.2 10*3/uL — ABNORMAL HIGH (ref 0.0–0.1)
Basophils Relative: 2 %
Eosinophils Absolute: 0.5 10*3/uL (ref 0.0–0.5)
Eosinophils Relative: 4 %
HCT: 40.5 % (ref 39.0–52.0)
Hemoglobin: 12.3 g/dL — ABNORMAL LOW (ref 13.0–17.0)
Immature Granulocytes: 0 %
Lymphocytes Relative: 65 %
Lymphs Abs: 9 10*3/uL — ABNORMAL HIGH (ref 0.7–4.0)
MCH: 24.3 pg — ABNORMAL LOW (ref 26.0–34.0)
MCHC: 30.4 g/dL (ref 30.0–36.0)
MCV: 80 fL (ref 80.0–100.0)
Monocytes Absolute: 0.7 10*3/uL (ref 0.1–1.0)
Monocytes Relative: 5 %
Neutro Abs: 3.3 10*3/uL (ref 1.7–7.7)
Neutrophils Relative %: 24 %
Platelet Count: 193 10*3/uL (ref 150–400)
RBC: 5.06 MIL/uL (ref 4.22–5.81)
RDW: 17 % — ABNORMAL HIGH (ref 11.5–15.5)
Smear Review: NORMAL
WBC Count: 13.7 10*3/uL — ABNORMAL HIGH (ref 4.0–10.5)
nRBC: 0 % (ref 0.0–0.2)

## 2023-05-30 LAB — CMP (CANCER CENTER ONLY)
ALT: 18 U/L (ref 0–44)
AST: 16 U/L (ref 15–41)
Albumin: 4.7 g/dL (ref 3.5–5.0)
Alkaline Phosphatase: 67 U/L (ref 38–126)
Anion gap: 5 (ref 5–15)
BUN: 18 mg/dL (ref 8–23)
CO2: 26 mmol/L (ref 22–32)
Calcium: 9.6 mg/dL (ref 8.9–10.3)
Chloride: 106 mmol/L (ref 98–111)
Creatinine: 1.75 mg/dL — ABNORMAL HIGH (ref 0.61–1.24)
GFR, Estimated: 39 mL/min — ABNORMAL LOW (ref >60)
Glucose, Bld: 151 mg/dL — ABNORMAL HIGH (ref 70–99)
Potassium: 4.6 mmol/L (ref 3.5–5.1)
Sodium: 137 mmol/L (ref 135–145)
Total Bilirubin: 0.6 mg/dL (ref 0.0–1.2)
Total Protein: 7.6 g/dL (ref 6.5–8.1)

## 2023-05-30 LAB — LACTATE DEHYDROGENASE: LDH: 124 U/L (ref 98–192)

## 2023-05-30 NOTE — Progress Notes (Signed)
 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 No symptoms of infection, fever, or drenching sweats. No palpable lymphadenopathy or hepatosplenomegaly. -NO indication for treatment. He understands the symptoms and signs to keep us  posted and the indications for treatment. -Continue current management and monitoring. -Return for follow-up and labs in four months.   HISTORY OF PRESENTING ILLNESS:   Todd Cummings 83 y.o. male is here because of lymphocytosis.  This is a very pleasant 83 year old male patient with past medical history significant for diabetes, hypertension, arthritis, benign prostatic hyperplasia, GERD related issues referred to hematology for progressive lymphocytosis and leukocytosis.    Discussed the use of AI scribe software for clinical note transcription with the patient, who gave verbal consent to proceed.  History of Present Illness Todd Cummings is an 83 year old male with chronic lymphocytic leukemia who presents for routine follow-up.  He has not experienced any new symptoms since the last visit. No fevers, drenching night sweats, or significant weight changes, although he notes a slight weight loss of a couple of pounds, which he describes as 'not too bad'.  He has not required any hospital visits and reports no recent falls. His weight has fluctuated, with a recent measurement of 222 pounds at home. He and his family keep track of his weight regularly.  He mentions recent travel, including a trip to the beach last week and a previous cruise to [place]. He is planning a trip to [place] in the fall and has  a timeshare there, which he notes helps with expenses.  He discusses his family, mentioning his son who lives nearby and works as an Art gallery manager.  No issues with appetite and no recurrent infections or early satiety.  No other B symptoms, recent infections. Rest of the pertinent 10 point ROS reviewed and neg.  MEDICAL HISTORY:  Past Medical History:  Diagnosis Date   Arthritis    BPH (benign prostatic hypertrophy)    Bronchospasm    Chronic sinusitis    DDD (degenerative disc disease), lumbar    Esophageal stricture    GERD (gastroesophageal reflux disease)    Heart disease    HLD (hyperlipidemia)    HTN (hypertension)    Kidney stones    Snoring     SURGICAL HISTORY: Past Surgical History:  Procedure Laterality Date   CATARACT EXTRACTION Bilateral    COLONOSCOPY     ESOPHAGEAL DILATION  10/02   ESOPHAGOGASTRODUODENOSCOPY (EGD) WITH PROPOFOL  N/A 01/16/2015   Procedure: ESOPHAGOGASTRODUODENOSCOPY (EGD) WITH PROPOFOL ;  Surgeon: Jolinda Necessary, MD;  Location: [facility] ENDOSCOPY;  Service: Endoscopy;  Laterality: N/A;   HEEL SPUR SURGERY  4/02   NASAL SINUS SURGERY     SAVORY DILATION N/A 01/16/2015   Procedure: SAVORY DILATION;  Surgeon: Jolinda Necessary, MD;  Location: [facility] ENDOSCOPY;  Service: Endoscopy;  Laterality: N/A;   TRANSURETHRAL RESECTION OF PROSTATE  6/02    SOCIAL HISTORY: Social History   Socioeconomic History   Marital status: Married    Spouse name: Not on file   Number of children: 3   Years of education: some college   Highest education level: Not on file  Occupational History  Occupation: Surveyor, 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   Right-handed.   Rare caffeine use.   Social Drivers of Corporate investment banker Strain: Not on file  Food Insecurity: Not on file  Transportation Needs: Not on file  Physical Activity: Not on file  Stress: Not on file  Social Connections: Not on file  Intimate Partner Violence: Not on file    FAMILY HISTORY: Family History  Adopted: Yes  Problem Relation Age of Onset   Sleep apnea Neg Hx    Alzheimer's disease Neg Hx     ALLERGIES:  has no known allergies.  MEDICATIONS:  Current Outpatient Medications  Medication Sig Dispense Refill   acetaminophen  (TYLENOL ) 500 MG tablet Take 500 mg by mouth every 4 (four) hours as needed.     albuterol  (VENTOLIN  HFA) 108 (90 Base) MCG/ACT inhaler Inhale 2 puffs into the lungs every 4 (four) hours as needed for wheezing, cough, or shortness of breath. 6.7 g 3   aspirin  81 MG tablet Take 81 mg by mouth at bedtime.     atorvastatin  (LIPITOR ) 20 MG tablet Take 1 tablet (20 mg total) by mouth daily. 90 tablet 3   dapagliflozin  propanediol (FARXIGA ) 10 MG TABS tablet Take 1 tablet (10 mg total) by mouth daily. 90 tablet 3   donepezil  (ARICEPT ) 10 MG tablet Take 1 tablet (10 mg total) by mouth at bedtime. 90 tablet 3   finasteride  (PROSCAR ) 5 MG tablet Take 1 tablet (5 mg total) by mouth daily. 90 tablet 3   fluticasone  (FLONASE ) 50 MCG/ACT nasal spray Place 2 sprays into both nostrils daily as needed for allergies.      losartan  (COZAAR ) 100 MG tablet Take 1 tablet (100 mg total) by mouth at bedtime. 90 tablet 3   metFORMIN  (GLUCOPHAGE -XR) 500 MG 24 hr tablet Take 1 tablet (500 mg total) by mouth 2 (two) times daily with food. 180 tablet 3   niacin  (NIASPAN ) 1000 MG CR tablet Take 1,000 mg by mouth at  bedtime.     Omega-3 Fatty Acids (FISH OIL) 1000 MG CAPS Take 2,000 mg by mouth daily.      omeprazole  (PRILOSEC ) 20 MG capsule Take 1 capsule (20 mg total) by mouth daily. 90 capsule 3   vitamin C (ASCORBIC ACID) 500 MG tablet Take 500 mg by mouth daily.     Zinc 30 MG TABS Take 1 tablet by mouth daily.     No current facility-administered medications for this visit.    PHYSICAL EXAMINATION: ECOG PERFORMANCE STATUS: 0 - Asymptomatic  Physical Exam Constitutional:      General: He is not in acute distress.    Appearance: Normal appearance.  Cardiovascular:     Rate and Rhythm: Normal rate and regular rhythm.  Pulmonary:  Effort: Pulmonary effort is normal.     Breath sounds: Normal breath sounds.  Abdominal:     General: There is no distension.     Palpations: There is no mass.  Musculoskeletal:        General: No swelling. Normal range of motion.  Lymphadenopathy:     Cervical: No cervical adenopathy.  Neurological:     Mental Status: He is alert.     LABORATORY DATA:  I have reviewed the data as listed Lab Results  Component Value Date   WBC 13.7 (H) 05/30/2023   HGB 12.3 (L) 05/30/2023   HCT 40.5 05/30/2023   MCV 80.0 05/30/2023   PLT 193 05/30/2023     Chemistry      Component Value Date/Time   NA 140 01/10/2023 1208   K 4.6 01/10/2023 1208   CL 105 01/10/2023 1208   CO2 27 01/10/2023 1208   BUN 13 01/10/2023 1208   CREATININE 1.45 (H) 01/10/2023 1208      Component Value Date/Time   CALCIUM  9.8 01/10/2023 1208   ALKPHOS 69 01/10/2023 1208   AST 17 01/10/2023 1208   ALT 18 01/10/2023 1208   BILITOT 0.5 01/10/2023 1208      RADIOGRAPHIC STUDIES: I have personally reviewed the radiological images as listed and agreed with the findings in the report. No results found.  All questions were answered. The patient knows to call the clinic with any problems, questions or concerns. I spent 20 minutes in the care of this patient including H, review of  records, counseling and coordination of care.     Murleen Arms, MD 05/30/2023 12:02 PM

## 2023-07-18 ENCOUNTER — Other Ambulatory Visit (HOSPITAL_COMMUNITY): Payer: Self-pay

## 2023-07-26 ENCOUNTER — Other Ambulatory Visit (HOSPITAL_COMMUNITY): Payer: Self-pay

## 2023-07-26 MED ORDER — OZEMPIC (1 MG/DOSE) 4 MG/3ML ~~LOC~~ SOPN
1.0000 mg | PEN_INJECTOR | SUBCUTANEOUS | 6 refills | Status: AC
  Filled 2023-07-26: qty 3, 28d supply, fill #0
  Filled 2023-09-19: qty 3, 28d supply, fill #1
  Filled 2023-10-18: qty 3, 28d supply, fill #2
  Filled 2023-11-15: qty 3, 28d supply, fill #3
  Filled 2023-12-13: qty 3, 28d supply, fill #4
  Filled 2024-01-09: qty 3, 28d supply, fill #5
  Filled 2024-02-05: qty 3, 28d supply, fill #6
  Filled ????-??-?? (×2): fill #5

## 2023-08-31 ENCOUNTER — Other Ambulatory Visit (HOSPITAL_COMMUNITY): Payer: Self-pay

## 2023-08-31 MED ORDER — PREDNISONE 5 MG (21) PO TBPK
ORAL_TABLET | ORAL | 1 refills | Status: AC
  Filled 2023-08-31: qty 21, 6d supply, fill #0
  Filled ????-??-??: fill #1

## 2023-10-03 ENCOUNTER — Other Ambulatory Visit (HOSPITAL_COMMUNITY): Payer: Self-pay

## 2023-10-06 ENCOUNTER — Other Ambulatory Visit: Payer: Self-pay | Admitting: *Deleted

## 2023-10-06 DIAGNOSIS — C9110 Chronic lymphocytic leukemia of B-cell type not having achieved remission: Secondary | ICD-10-CM

## 2023-10-07 ENCOUNTER — Other Ambulatory Visit (HOSPITAL_COMMUNITY): Payer: Self-pay

## 2023-10-07 ENCOUNTER — Other Ambulatory Visit: Payer: Self-pay

## 2023-10-10 ENCOUNTER — Inpatient Hospital Stay (HOSPITAL_BASED_OUTPATIENT_CLINIC_OR_DEPARTMENT_OTHER): Admitting: Hematology and Oncology

## 2023-10-10 ENCOUNTER — Inpatient Hospital Stay: Attending: Hematology and Oncology

## 2023-10-10 ENCOUNTER — Other Ambulatory Visit (HOSPITAL_COMMUNITY): Payer: Self-pay

## 2023-10-10 VITALS — BP 126/68 | HR 65 | Temp 98.6°F | Resp 13 | Wt 208.3 lb

## 2023-10-10 DIAGNOSIS — I1310 Hypertensive heart and chronic kidney disease without heart failure, with stage 1 through stage 4 chronic kidney disease, or unspecified chronic kidney disease: Secondary | ICD-10-CM | POA: Insufficient documentation

## 2023-10-10 DIAGNOSIS — Z87442 Personal history of urinary calculi: Secondary | ICD-10-CM | POA: Insufficient documentation

## 2023-10-10 DIAGNOSIS — N189 Chronic kidney disease, unspecified: Secondary | ICD-10-CM | POA: Diagnosis not present

## 2023-10-10 DIAGNOSIS — E785 Hyperlipidemia, unspecified: Secondary | ICD-10-CM | POA: Diagnosis not present

## 2023-10-10 DIAGNOSIS — Z7985 Long-term (current) use of injectable non-insulin antidiabetic drugs: Secondary | ICD-10-CM | POA: Diagnosis not present

## 2023-10-10 DIAGNOSIS — Z7982 Long term (current) use of aspirin: Secondary | ICD-10-CM | POA: Diagnosis not present

## 2023-10-10 DIAGNOSIS — Z7984 Long term (current) use of oral hypoglycemic drugs: Secondary | ICD-10-CM | POA: Insufficient documentation

## 2023-10-10 DIAGNOSIS — E1122 Type 2 diabetes mellitus with diabetic chronic kidney disease: Secondary | ICD-10-CM | POA: Insufficient documentation

## 2023-10-10 DIAGNOSIS — C9110 Chronic lymphocytic leukemia of B-cell type not having achieved remission: Secondary | ICD-10-CM | POA: Diagnosis not present

## 2023-10-10 DIAGNOSIS — Z79899 Other long term (current) drug therapy: Secondary | ICD-10-CM | POA: Insufficient documentation

## 2023-10-10 DIAGNOSIS — Z1509 Genetic susceptibility to other malignant neoplasm: Secondary | ICD-10-CM | POA: Diagnosis present

## 2023-10-10 DIAGNOSIS — N400 Enlarged prostate without lower urinary tract symptoms: Secondary | ICD-10-CM | POA: Diagnosis not present

## 2023-10-10 DIAGNOSIS — Z87891 Personal history of nicotine dependence: Secondary | ICD-10-CM | POA: Diagnosis not present

## 2023-10-10 LAB — CMP (CANCER CENTER ONLY)
ALT: 16 U/L (ref 0–44)
AST: 14 U/L — ABNORMAL LOW (ref 15–41)
Albumin: 4.3 g/dL (ref 3.5–5.0)
Alkaline Phosphatase: 66 U/L (ref 38–126)
Anion gap: 7 (ref 5–15)
BUN: 12 mg/dL (ref 8–23)
CO2: 27 mmol/L (ref 22–32)
Calcium: 9.8 mg/dL (ref 8.9–10.3)
Chloride: 106 mmol/L (ref 98–111)
Creatinine: 1.46 mg/dL — ABNORMAL HIGH (ref 0.61–1.24)
GFR, Estimated: 48 mL/min — ABNORMAL LOW (ref >60)
Glucose, Bld: 146 mg/dL — ABNORMAL HIGH (ref 70–99)
Potassium: 5.1 mmol/L (ref 3.5–5.1)
Sodium: 140 mmol/L (ref 135–145)
Total Bilirubin: 0.5 mg/dL (ref 0.0–1.2)
Total Protein: 7.1 g/dL (ref 6.5–8.1)

## 2023-10-10 LAB — CBC WITH DIFFERENTIAL (CANCER CENTER ONLY)
Abs Immature Granulocytes: 0.02 K/uL (ref 0.00–0.07)
Basophils Absolute: 0.2 K/uL — ABNORMAL HIGH (ref 0.0–0.1)
Basophils Relative: 1 %
Eosinophils Absolute: 0.3 K/uL (ref 0.0–0.5)
Eosinophils Relative: 2 %
HCT: 43.9 % (ref 39.0–52.0)
Hemoglobin: 13.2 g/dL (ref 13.0–17.0)
Immature Granulocytes: 0 %
Lymphocytes Relative: 64 %
Lymphs Abs: 10.1 K/uL — ABNORMAL HIGH (ref 0.7–4.0)
MCH: 24.5 pg — ABNORMAL LOW (ref 26.0–34.0)
MCHC: 30.1 g/dL (ref 30.0–36.0)
MCV: 81.4 fL (ref 80.0–100.0)
Monocytes Absolute: 0.6 K/uL (ref 0.1–1.0)
Monocytes Relative: 4 %
Neutro Abs: 4.6 K/uL (ref 1.7–7.7)
Neutrophils Relative %: 29 %
Platelet Count: 267 K/uL (ref 150–400)
RBC: 5.39 MIL/uL (ref 4.22–5.81)
RDW: 17.4 % — ABNORMAL HIGH (ref 11.5–15.5)
Smear Review: NORMAL
WBC Count: 15.8 K/uL — ABNORMAL HIGH (ref 4.0–10.5)
nRBC: 0 % (ref 0.0–0.2)

## 2023-10-10 MED ORDER — FINASTERIDE 5 MG PO TABS
5.0000 mg | ORAL_TABLET | Freq: Every day | ORAL | 3 refills | Status: AC
  Filled 2023-10-10: qty 100, 100d supply, fill #0
  Filled 2024-01-26: qty 100, 100d supply, fill #1

## 2023-10-10 NOTE — Progress Notes (Signed)
 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 CLL stable with no new symptoms or significant changes in blood counts. - Schedule follow-up in 3 to 4 months. - Instruct to report new symptoms such as drenching night sweats, low-grade fevers, unexpected weight loss, persistent infections, or new lumps.  Osteoarthritis of the knee Knee pain exacerbated by fall, limiting walking. Orthopedic evaluation showed mild narrowing, no significant structural damage. - Encourage gradual increase in walking duration to 20 minutes as tolerated.  Chronic kidney disease, unspecified stage Chronic kidney disease with stable renal function, recent creatinine level 1.46 showing slight improvement. - Avoid NSAIDs such as Aleve.  HISTORY OF PRESENTING ILLNESS:   Todd Cummings 83 y.o. male is here because of lymphocytosis.  This is a very pleasant 83 year old male patient with past medical history significant for diabetes, hypertension, arthritis, benign prostatic hyperplasia, GERD related issues referred to hematology for progressive lymphocytosis and leukocytosis.    Discussed the use of AI scribe software for clinical note transcription with the patient, who gave verbal consent to proceed.  History of Present Illness He is an 83 year old male with chronic lymphocytic leukemia (CLL) who presents for routine follow-up.  He has experienced a weight loss of 20 pounds, which he attributes to the use of Ozempic . His appetite has decreased, although he still enjoys sweet foods like ice cream. No fevers, drenching night sweats, or changes in  breathing. He also reports no significant constipation despite the use of Ozempic .  He has knee issues and lower back pain, which he attributes to a fall. The fall resulted in knee pain that limits his walking. An orthopedic doctor noted some narrowing in the knee but no significant damage. He is working on increasing his walking duration to 20 minutes a day.  He used to walk frequently in the halls of his previous residence but now finds it challenging to complete four laps. No recent infections or changes in bowel movements.  His most recent creatinine level is 1.46.    MEDICAL HISTORY:  Past Medical History:  Diagnosis Date   Arthritis    BPH (benign prostatic hypertrophy)    Bronchospasm    Chronic sinusitis    DDD (degenerative disc disease), lumbar    Esophageal stricture    GERD (gastroesophageal reflux disease)    Heart disease    HLD (hyperlipidemia)    HTN (hypertension)    Kidney stones    Snoring     SURGICAL HISTORY: Past Surgical History:  Procedure Laterality Date   CATARACT EXTRACTION Bilateral    COLONOSCOPY     ESOPHAGEAL DILATION  10/02   ESOPHAGOGASTRODUODENOSCOPY (EGD) WITH PROPOFOL  N/A 01/16/2015   Procedure: ESOPHAGOGASTRODUODENOSCOPY (EGD) WITH PROPOFOL ;  Surgeon: Agent Bohr, MD;  Location: [facility] ENDOSCOPY;  Service: Endoscopy;  Laterality: N/A;   HEEL SPUR SURGERY  4/02   NASAL SINUS SURGERY     SAVORY DILATION N/A 01/16/2015   Procedure: SAVORY DILATION;  Surgeon: Agent Bohr, MD;  Location: [facility] ENDOSCOPY;  Service: Endoscopy;  Laterality: N/A;   TRANSURETHRAL RESECTION OF PROSTATE  6/02    SOCIAL HISTORY: Social History   Socioeconomic History   Marital  status: Married    Spouse name: Not on file   Number of children: 3   Years of education: some college   Highest education level: Not on file  Occupational History   Occupation: Surveyor, minerals - [employer]  Tobacco Use   Smoking status: Former    Current packs/day: 0.00    Average  packs/day: 0.5 packs/day for 35.0 years (17.5 ttl pk-yrs)    Types: Cigarettes    Start date: 01/08/1967    Quit date: 01/07/2002    Years since quitting: 21.7   Smokeless tobacco: Former  Building services engineer status: Never Used  Substance and Sexual Activity   Alcohol use: Yes    Alcohol/week: 2.0 standard drinks of alcohol    Types: 2 Shots of liquor per week   Drug use: Never   Sexual activity: Yes  Other Topics Concern   Not on file  Social History Narrative   Patient lives at home with wife.   Right-handed.   Rare caffeine use.   Social Drivers of Corporate investment banker Strain: Not on file  Food Insecurity: Not on file  Transportation Needs: Not on file  Physical Activity: Not on file  Stress: Not on file  Social Connections: Not on file  Intimate Partner Violence: Not on file    FAMILY HISTORY: Family History  Adopted: Yes  Problem Relation Age of Onset   Sleep apnea Neg Hx    Alzheimer's disease Neg Hx     ALLERGIES:  has no known allergies.  MEDICATIONS:  Current Outpatient Medications  Medication Sig Dispense Refill   acetaminophen  (TYLENOL ) 500 MG tablet Take 500 mg by mouth every 4 (four) hours as needed.     albuterol  (VENTOLIN  HFA) 108 (90 Base) MCG/ACT inhaler Inhale 2 puffs into the lungs every 4 (four) hours as needed for wheezing, cough, or shortness of breath. 6.7 g 3   aspirin  81 MG tablet Take 81 mg by mouth at bedtime.     atorvastatin  (LIPITOR ) 20 MG tablet Take 1 tablet (20 mg total) by mouth daily. 90 tablet 3   dapagliflozin  propanediol (FARXIGA ) 10 MG TABS tablet Take 1 tablet (10 mg total) by mouth daily. 90 tablet 3   donepezil  (ARICEPT ) 10 MG tablet Take 1 tablet (10 mg total) by mouth at bedtime. 90 tablet 3   finasteride  (PROSCAR ) 5 MG tablet Take 1 tablet (5 mg total) by mouth daily. 100 tablet 3   fluticasone  (FLONASE ) 50 MCG/ACT nasal spray Place 2 sprays into both nostrils daily as needed for allergies.      losartan  (COZAAR )  100 MG tablet Take 1 tablet (100 mg total) by mouth at bedtime. 90 tablet 3   metFORMIN  (GLUCOPHAGE -XR) 500 MG 24 hr tablet Take 1 tablet (500 mg total) by mouth 2 (two) times daily with food. 180 tablet 3   omeprazole  (PRILOSEC ) 20 MG capsule Take 1 capsule (20 mg total) by mouth daily. 90 capsule 3   predniSONE  (STERAPRED UNI-PAK 21 TAB) 5 MG (21) TBPK tablet Take as directed on package for 6 days. 21 each 1   Semaglutide , 1 MG/DOSE, (OZEMPIC , 1 MG/DOSE,) 4 MG/3ML SOPN Inject 1 mg into the skin once a week. 3 mL 6   vitamin C (ASCORBIC ACID) 500 MG tablet Take 500 mg by mouth daily.     Zinc 30 MG TABS Take 1 tablet by mouth daily.     niacin  (NIASPAN ) 1000 MG CR tablet Take 1,000 mg by mouth at  bedtime.     Omega-3 Fatty Acids (FISH OIL) 1000 MG CAPS Take 2,000 mg by mouth daily.      No current facility-administered medications for this visit.    PHYSICAL EXAMINATION: ECOG PERFORMANCE STATUS: 0 - Asymptomatic  Physical Exam Constitutional:      General: He is not in acute distress.    Appearance: Normal appearance.  Cardiovascular:     Rate and Rhythm: Normal rate and regular rhythm.  Pulmonary:     Effort: Pulmonary effort is normal.     Breath sounds: Normal breath sounds.  Abdominal:     General: There is no distension.     Palpations: There is no mass.  Musculoskeletal:        General: No swelling. Normal range of motion.  Lymphadenopathy:     Cervical: No cervical adenopathy.  Neurological:     Mental Status: He is alert.      LABORATORY DATA:  I have reviewed the data as listed Lab Results  Component Value Date   WBC 15.8 (H) 10/10/2023   HGB 13.2 10/10/2023   HCT 43.9 10/10/2023   MCV 81.4 10/10/2023   PLT 267 10/10/2023     Chemistry      Component Value Date/Time   NA 137 05/30/2023 1135   K 4.6 05/30/2023 1135   CL 106 05/30/2023 1135   CO2 26 05/30/2023 1135   BUN 18 05/30/2023 1135   CREATININE 1.75 (H) 05/30/2023 1135      Component Value  Date/Time   CALCIUM  9.6 05/30/2023 1135   ALKPHOS 67 05/30/2023 1135   AST 16 05/30/2023 1135   ALT 18 05/30/2023 1135   BILITOT 0.6 05/30/2023 1135      RADIOGRAPHIC STUDIES: I have personally reviewed the radiological images as listed and agreed with the findings in the report. No results found.  All questions were answered. The patient knows to call the clinic with any problems, questions or concerns. I spent 30 minutes in the care of this patient including H, review of records, counseling and coordination of care.     Amber Stalls, MD 10/10/2023 10:00 AM

## 2023-10-14 ENCOUNTER — Other Ambulatory Visit (HOSPITAL_COMMUNITY): Payer: Self-pay

## 2023-10-18 ENCOUNTER — Other Ambulatory Visit (HOSPITAL_COMMUNITY): Payer: Self-pay

## 2023-10-18 ENCOUNTER — Other Ambulatory Visit: Payer: Self-pay

## 2023-10-18 MED ORDER — LOSARTAN POTASSIUM 100 MG PO TABS
100.0000 mg | ORAL_TABLET | Freq: Every day | ORAL | 3 refills | Status: AC
  Filled 2023-10-18: qty 90, 90d supply, fill #0
  Filled 2024-01-26: qty 90, 90d supply, fill #1

## 2023-10-24 ENCOUNTER — Other Ambulatory Visit (HOSPITAL_COMMUNITY): Payer: Self-pay

## 2023-10-24 MED ORDER — ACCU-CHEK GUIDE W/DEVICE KIT
PACK | 0 refills | Status: AC
  Filled 2023-10-24: qty 1, 30d supply, fill #0

## 2023-10-24 MED ORDER — ACCU-CHEK GUIDE TEST VI STRP
ORAL_STRIP | 6 refills | Status: AC
  Filled 2023-10-24: qty 100, 25d supply, fill #0
  Filled 2023-11-28: qty 100, 25d supply, fill #1
  Filled 2024-01-29: qty 100, 25d supply, fill #2

## 2023-10-24 MED ORDER — ACCU-CHEK SOFTCLIX LANCETS MISC
6 refills | Status: AC
  Filled 2023-10-24: qty 100, 25d supply, fill #0
  Filled 2023-11-28: qty 100, 25d supply, fill #1
  Filled 2024-01-29: qty 100, 25d supply, fill #2

## 2023-10-28 ENCOUNTER — Other Ambulatory Visit (HOSPITAL_COMMUNITY): Payer: Self-pay

## 2023-11-15 ENCOUNTER — Other Ambulatory Visit (HOSPITAL_COMMUNITY): Payer: Self-pay

## 2023-11-15 ENCOUNTER — Other Ambulatory Visit: Payer: Self-pay

## 2023-11-16 ENCOUNTER — Other Ambulatory Visit (HOSPITAL_COMMUNITY): Payer: Self-pay

## 2023-11-16 MED ORDER — DONEPEZIL HCL 10 MG PO TABS
10.0000 mg | ORAL_TABLET | Freq: Every evening | ORAL | 3 refills | Status: AC
  Filled 2023-11-16: qty 90, 90d supply, fill #0
  Filled 2024-02-17: qty 90, 90d supply, fill #1

## 2023-12-26 ENCOUNTER — Other Ambulatory Visit (HOSPITAL_COMMUNITY): Payer: Self-pay

## 2023-12-26 MED ORDER — METFORMIN HCL ER 500 MG PO TB24
500.0000 mg | ORAL_TABLET | Freq: Two times a day (BID) | ORAL | 3 refills | Status: AC
  Filled 2023-12-26: qty 180, 90d supply, fill #0

## 2023-12-27 ENCOUNTER — Other Ambulatory Visit: Payer: Self-pay

## 2024-01-08 ENCOUNTER — Other Ambulatory Visit (HOSPITAL_COMMUNITY): Payer: Self-pay

## 2024-01-08 MED ORDER — ATORVASTATIN CALCIUM 20 MG PO TABS
20.0000 mg | ORAL_TABLET | Freq: Every day | ORAL | 3 refills | Status: AC
  Filled 2024-01-08: qty 90, 90d supply, fill #0

## 2024-01-09 ENCOUNTER — Other Ambulatory Visit (HOSPITAL_COMMUNITY): Payer: Self-pay

## 2024-01-09 ENCOUNTER — Other Ambulatory Visit

## 2024-01-09 ENCOUNTER — Ambulatory Visit: Admitting: Hematology and Oncology

## 2024-01-09 ENCOUNTER — Other Ambulatory Visit: Payer: Self-pay

## 2024-01-09 DIAGNOSIS — C9110 Chronic lymphocytic leukemia of B-cell type not having achieved remission: Secondary | ICD-10-CM

## 2024-01-09 MED ORDER — ATORVASTATIN CALCIUM 20 MG PO TABS
20.0000 mg | ORAL_TABLET | Freq: Every day | ORAL | 3 refills | Status: AC
  Filled 2024-01-09: qty 100, 100d supply, fill #0

## 2024-01-10 ENCOUNTER — Inpatient Hospital Stay: Attending: Hematology and Oncology

## 2024-01-10 ENCOUNTER — Encounter: Payer: Self-pay | Admitting: Hematology and Oncology

## 2024-01-10 ENCOUNTER — Inpatient Hospital Stay: Admitting: Hematology and Oncology

## 2024-01-10 VITALS — BP 126/88 | HR 56 | Temp 97.9°F | Resp 16 | Wt 202.0 lb

## 2024-01-10 DIAGNOSIS — C9110 Chronic lymphocytic leukemia of B-cell type not having achieved remission: Secondary | ICD-10-CM

## 2024-01-10 DIAGNOSIS — Z87891 Personal history of nicotine dependence: Secondary | ICD-10-CM | POA: Diagnosis not present

## 2024-01-10 DIAGNOSIS — Z7982 Long term (current) use of aspirin: Secondary | ICD-10-CM | POA: Insufficient documentation

## 2024-01-10 DIAGNOSIS — Z79899 Other long term (current) drug therapy: Secondary | ICD-10-CM | POA: Insufficient documentation

## 2024-01-10 DIAGNOSIS — Z7984 Long term (current) use of oral hypoglycemic drugs: Secondary | ICD-10-CM | POA: Diagnosis not present

## 2024-01-10 DIAGNOSIS — Z1509 Genetic susceptibility to other malignant neoplasm: Secondary | ICD-10-CM | POA: Insufficient documentation

## 2024-01-10 LAB — CMP (CANCER CENTER ONLY)
ALT: 19 U/L (ref 0–44)
AST: 21 U/L (ref 15–41)
Albumin: 4.4 g/dL (ref 3.5–5.0)
Alkaline Phosphatase: 77 U/L (ref 38–126)
Anion gap: 10 (ref 5–15)
BUN: 17 mg/dL (ref 8–23)
CO2: 24 mmol/L (ref 22–32)
Calcium: 9.8 mg/dL (ref 8.9–10.3)
Chloride: 106 mmol/L (ref 98–111)
Creatinine: 1.5 mg/dL — ABNORMAL HIGH (ref 0.61–1.24)
GFR, Estimated: 46 mL/min — ABNORMAL LOW (ref >60)
Glucose, Bld: 127 mg/dL — ABNORMAL HIGH (ref 70–99)
Potassium: 5 mmol/L (ref 3.5–5.1)
Sodium: 141 mmol/L (ref 135–145)
Total Bilirubin: 0.5 mg/dL (ref 0.0–1.2)
Total Protein: 7.7 g/dL (ref 6.5–8.1)

## 2024-01-10 LAB — CBC WITH DIFFERENTIAL (CANCER CENTER ONLY)
Abs Immature Granulocytes: 0.04 K/uL (ref 0.00–0.07)
Basophils Absolute: 0.3 K/uL — ABNORMAL HIGH (ref 0.0–0.1)
Basophils Relative: 1 %
Eosinophils Absolute: 0.3 K/uL (ref 0.0–0.5)
Eosinophils Relative: 2 %
HCT: 44.7 % (ref 39.0–52.0)
Hemoglobin: 13.4 g/dL (ref 13.0–17.0)
Immature Granulocytes: 0 %
Lymphocytes Relative: 68 %
Lymphs Abs: 14.3 K/uL — ABNORMAL HIGH (ref 0.7–4.0)
MCH: 24.2 pg — ABNORMAL LOW (ref 26.0–34.0)
MCHC: 30 g/dL (ref 30.0–36.0)
MCV: 80.7 fL (ref 80.0–100.0)
Monocytes Absolute: 0.9 K/uL (ref 0.1–1.0)
Monocytes Relative: 4 %
Neutro Abs: 5.3 K/uL (ref 1.7–7.7)
Neutrophils Relative %: 25 %
Platelet Count: 235 K/uL (ref 150–400)
RBC: 5.54 MIL/uL (ref 4.22–5.81)
RDW: 17.6 % — ABNORMAL HIGH (ref 11.5–15.5)
Smear Review: NORMAL
WBC Count: 21.2 K/uL — ABNORMAL HIGH (ref 4.0–10.5)
nRBC: 0 % (ref 0.0–0.2)

## 2024-01-10 NOTE — Progress Notes (Signed)
 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 Wt loss secondary to ozempic  He recently had a URI, no other major infections No palpable adenopathy or hepatosplenomegaly on exam today Labs reviewed, leukocytosis worsened compared to last visit, but no anemia or thrombocytopenia noted. No other findings noted on exam today We will continue surveillance for CLL, repeat FU and labs in 3/4 months  With regards to URI, supportive care advised Assessment & Plan Chronic lymphocytic leukemia    HISTORY OF PRESENTING ILLNESS:   Discussed the use of AI scribe software for clinical note transcription with the patient, who gave verbal consent to proceed.  History of Present Illness Todd Cummings is here for follow up for CLL today  Since his last visit, he has been doing well. No fevers, drenching night sweats Appetite and weight have changed because of ozempic . He lost about 25 lbs approximately. He recently had an episode of cold, URI, no covid or flu.  Rest of the pertinent 10 point Ros reviewed and neg.  MEDICAL HISTORY:  Past Medical History:  Diagnosis Date   Arthritis    BPH (benign prostatic hypertrophy)    Bronchospasm    Chronic sinusitis    DDD (degenerative disc disease), lumbar    Esophageal stricture    GERD (gastroesophageal reflux disease)    Heart disease    HLD (hyperlipidemia)    HTN (hypertension)    Kidney stones    Snoring     SURGICAL HISTORY: Past Surgical History:  Procedure Laterality Date   CATARACT EXTRACTION Bilateral    COLONOSCOPY     ESOPHAGEAL DILATION  10/02   ESOPHAGOGASTRODUODENOSCOPY (EGD) WITH PROPOFOL  N/A 01/16/2015    Procedure: ESOPHAGOGASTRODUODENOSCOPY (EGD) WITH PROPOFOL ;  Surgeon: Agent Bohr, MD;  Location: [facility] ENDOSCOPY;  Service: Endoscopy;  Laterality: N/A;   HEEL SPUR SURGERY  4/02   NASAL SINUS SURGERY     SAVORY DILATION N/A 01/16/2015   Procedure: SAVORY DILATION;  Surgeon: Agent Bohr, MD;  Location: [facility] ENDOSCOPY;  Service: Endoscopy;  Laterality: N/A;   TRANSURETHRAL RESECTION OF PROSTATE  6/02    SOCIAL HISTORY: Social History   Socioeconomic History   Marital status: Married    Spouse name: Not on file   Number of children: 3   Years of education: some college   Highest education level: Not on file  Occupational History   Occupation: Surveyor, 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   Right-handed.   Rare caffeine use.   Social Drivers  of Health   Financial Resource Strain: Not on file  Food Insecurity: Not on file  Transportation Needs: Not on file  Physical Activity: Not on file  Stress: Not on file  Social Connections: Not on file  Intimate Partner Violence: Not on file    FAMILY HISTORY: Family History  Adopted: Yes  Problem Relation Age of Onset   Sleep apnea Neg Hx    Alzheimer's disease Neg Hx     ALLERGIES:  has no known allergies.  MEDICATIONS:  Current Outpatient Medications  Medication Sig Dispense Refill   Accu-Chek Softclix Lancets lancets Use 2 to 4 times daily as directed 100 each 6    acetaminophen  (TYLENOL ) 500 MG tablet Take 500 mg by mouth every 4 (four) hours as needed.     albuterol  (VENTOLIN  HFA) 108 (90 Base) MCG/ACT inhaler Inhale 2 puffs into the lungs every 4 (four) hours as needed for wheezing, cough, or shortness of breath. 6.7 g 3   aspirin  81 MG tablet Take 81 mg by mouth at bedtime.     atorvastatin  (LIPITOR ) 20 MG tablet Take 1 tablet (20 mg total) by mouth daily. 90 tablet 3   atorvastatin  (LIPITOR ) 20 MG tablet Take 1 tablet (20 mg total) by mouth daily. 100 tablet 3   Blood Glucose Monitoring Suppl (ACCU-CHEK GUIDE) w/Device KIT Use as directed 1 kit 0   dapagliflozin  propanediol (FARXIGA ) 10 MG TABS tablet Take 1 tablet (10 mg total) by mouth daily. 90 tablet 3   donepezil  (ARICEPT ) 10 MG tablet Take 1 tablet (10 mg total) by mouth at bedtime. 90 tablet 3   finasteride  (PROSCAR ) 5 MG tablet Take 1 tablet (5 mg total) by mouth daily. 100 tablet 3   fluticasone  (FLONASE ) 50 MCG/ACT nasal spray Place 2 sprays into both nostrils daily as needed for allergies.      glucose blood (ACCU-CHEK GUIDE TEST) test strip Use as directed 2 - 4 times daily 100 strip 6   losartan  (COZAAR ) 100 MG tablet Take 1 tablet (100 mg total) by mouth at bedtime. 100 tablet 3   metFORMIN  (GLUCOPHAGE -XR) 500 MG 24 hr tablet Take 1 tablet (500 mg total) by mouth with food 2 (two) times daily. 180 tablet 3   niacin  (NIASPAN ) 1000 MG CR tablet Take 1,000 mg by mouth at bedtime.     Omega-3 Fatty Acids (FISH OIL) 1000 MG CAPS Take 2,000 mg by mouth daily.      omeprazole  (PRILOSEC ) 20 MG capsule Take 1 capsule (20 mg total) by mouth daily. 90 capsule 3   predniSONE  (STERAPRED UNI-PAK 21 TAB) 5 MG (21) TBPK tablet Take as directed on package for 6 days. 21 each 1   Semaglutide , 1 MG/DOSE, (OZEMPIC , 1 MG/DOSE,) 4 MG/3ML SOPN Inject 1 mg into the skin once a week. 3 mL 6   vitamin C (ASCORBIC ACID) 500 MG tablet Take 500 mg by mouth daily.     Zinc 30 MG TABS Take 1 tablet by mouth daily.      No current facility-administered medications for this visit.    PHYSICAL EXAMINATION: ECOG PERFORMANCE STATUS: 0 - Asymptomatic  Physical Exam Constitutional:      General: He is not in acute distress.    Appearance: Normal appearance.  Cardiovascular:     Rate and Rhythm: Normal rate and regular rhythm.  Pulmonary:     Effort: Pulmonary effort is normal.     Breath sounds: Normal breath sounds.  Abdominal:     General: There  is no distension.     Palpations: There is no mass.  Musculoskeletal:        General: No swelling. Normal range of motion.  Lymphadenopathy:     Cervical: No cervical adenopathy.  Neurological:     Mental Status: He is alert.      LABORATORY DATA:  I have reviewed the data as listed Lab Results  Component Value Date   WBC 15.8 (H) 10/10/2023   HGB 13.2 10/10/2023   HCT 43.9 10/10/2023   MCV 81.4 10/10/2023   PLT 267 10/10/2023     Chemistry      Component Value Date/Time   NA 140 10/10/2023 0925   K 5.1 10/10/2023 0925   CL 106 10/10/2023 0925   CO2 27 10/10/2023 0925   BUN 12 10/10/2023 0925   CREATININE 1.46 (H) 10/10/2023 0925      Component Value Date/Time   CALCIUM  9.8 10/10/2023 0925   ALKPHOS 66 10/10/2023 0925   AST 14 (L) 10/10/2023 0925   ALT 16 10/10/2023 0925   BILITOT 0.5 10/10/2023 0925     CBC from today review, lymphocytosis noted, mildly worse from last visit No anemia or thrombocytopenia No major concerns on CMP.  RADIOGRAPHIC STUDIES: I have personally reviewed the radiological images as listed and agreed with the findings in the report. No results found.  All questions were answered. The patient knows to call the clinic with any problems, questions or concerns. I spent 20 minutes in the care of this patient including H, review of records, counseling and coordination of care.     Amber Stalls, MD 01/10/2024 8:22 AM

## 2024-01-11 ENCOUNTER — Other Ambulatory Visit (HOSPITAL_COMMUNITY): Payer: Self-pay

## 2024-01-11 MED ORDER — OMEPRAZOLE 20 MG PO CPDR
20.0000 mg | DELAYED_RELEASE_CAPSULE | Freq: Every day | ORAL | 3 refills | Status: AC
  Filled 2024-01-11: qty 100, 100d supply, fill #0

## 2024-01-26 ENCOUNTER — Other Ambulatory Visit: Payer: Self-pay

## 2024-01-26 ENCOUNTER — Other Ambulatory Visit (HOSPITAL_COMMUNITY): Payer: Self-pay

## 2024-01-26 MED ORDER — DAPAGLIFLOZIN PROPANEDIOL 10 MG PO TABS
10.0000 mg | ORAL_TABLET | Freq: Every day | ORAL | 3 refills | Status: AC
  Filled 2024-01-26: qty 90, 90d supply, fill #0

## 2024-01-29 ENCOUNTER — Other Ambulatory Visit: Payer: Self-pay

## 2024-02-17 ENCOUNTER — Other Ambulatory Visit (HOSPITAL_COMMUNITY): Payer: Self-pay

## 2024-03-14 ENCOUNTER — Other Ambulatory Visit (HOSPITAL_COMMUNITY): Payer: Self-pay

## 2024-03-15 ENCOUNTER — Other Ambulatory Visit (HOSPITAL_COMMUNITY): Payer: Self-pay

## 2024-05-10 ENCOUNTER — Inpatient Hospital Stay

## 2024-05-10 ENCOUNTER — Inpatient Hospital Stay: Admitting: Hematology and Oncology
